# Patient Record
Sex: Female | Born: 1985
Health system: Southern US, Community
[De-identification: ages and names within clinical notes are randomized; demographics above are authoritative.]

## PROBLEM LIST (undated history)

## (undated) DIAGNOSIS — Z789 Other specified health status: Secondary | ICD-10-CM

## (undated) HISTORY — PX: NO PAST SURGERIES: SHX2092

## (undated) HISTORY — PX: OTHER SURGICAL HISTORY: SHX169

---

## 2012-08-21 ENCOUNTER — Other Ambulatory Visit (HOSPITAL_COMMUNITY)
Admission: RE | Admit: 2012-08-21 | Discharge: 2012-08-21 | Disposition: A | Discharge status: Home / Self Care | Payer: No Typology Code available for payment source | Source: Ambulatory Visit | Attending: Family Medicine | Admitting: Family Medicine

## 2012-08-21 DIAGNOSIS — Z1151 Encounter for screening for human papillomavirus (HPV): Secondary | ICD-10-CM | POA: Insufficient documentation

## 2012-08-21 DIAGNOSIS — Z124 Encounter for screening for malignant neoplasm of cervix: Secondary | ICD-10-CM | POA: Insufficient documentation

## 2014-02-01 NOTE — L&D Delivery Note (Signed)
Operative Delivery Note At 2:59 AM a viable female was delivered via Vaginal, Vacuum Investment banker, operational(Extractor).  Presentation: vertex; Position: Right,, Occiput,, Anterior; Station: +3 with pushing.  Patient was FD at 8:45 but not adequate until 11:45 when they started to increase pitocin after placing IUPC.  The patient then pushed for 3hr with exhaustion.  Options where discussed initially after 2hrs of pushing with adequate contractions and the patient wanted to push for another hour.  She ultimately opted for VAVD.  An interpreter was used the entire time (interpreter [phone]).  Verbal consent: obtained from patient.  Risks and benefits discussed in detail.  Risks include, but are not limited to the risks of anesthesia, bleeding, infection, damage to maternal tissues, fetal cephalhematoma.  There is also the risk of inability to effect vaginal delivery of the head, or shoulder dystocia that cannot be resolved by established maneuvers, leading to the need for emergency cesarean section.  APGAR: 8, 9; weight  .   Placenta status: Intact, Spontaneous.   Cord: 3 vessels with the following complications: None.  Cord pH: n/a  Anesthesia: Epidural  Instruments: Bell shaped vacuum Episiotomy: None Lacerations: 3rd degree Suture Repair: 2.0 3.0 vicryl and 0 vicryl Est. Blood Loss (mL):  576 cc  Mom to postpartum.  Baby to Couplet care / Skin to Skin.  Herschel Fleagle Y 12/01/2014, 4:04 AM

## 2014-05-20 LAB — OB RESULTS CONSOLE HIV ANTIBODY (ROUTINE TESTING): HIV: NONREACTIVE

## 2014-05-20 LAB — OB RESULTS CONSOLE ABO/RH: RH TYPE: POSITIVE

## 2014-05-20 LAB — OB RESULTS CONSOLE RPR: RPR: NONREACTIVE

## 2014-05-20 LAB — OB RESULTS CONSOLE HEPATITIS B SURFACE ANTIGEN: Hepatitis B Surface Ag: NEGATIVE

## 2014-05-20 LAB — OB RESULTS CONSOLE ANTIBODY SCREEN: Antibody Screen: NEGATIVE

## 2014-05-20 LAB — OB RESULTS CONSOLE RUBELLA ANTIBODY, IGM: RUBELLA: IMMUNE

## 2014-06-03 ENCOUNTER — Other Ambulatory Visit: Payer: Self-pay | Admitting: Obstetrics and Gynecology

## 2014-06-03 ENCOUNTER — Other Ambulatory Visit (HOSPITAL_COMMUNITY)
Admission: RE | Admit: 2014-06-03 | Discharge: 2014-06-03 | Disposition: A | Discharge status: Home / Self Care | Payer: 59 | Source: Ambulatory Visit | Attending: Obstetrics and Gynecology | Admitting: Obstetrics and Gynecology

## 2014-06-03 DIAGNOSIS — Z01419 Encounter for gynecological examination (general) (routine) without abnormal findings: Secondary | ICD-10-CM | POA: Diagnosis not present

## 2014-06-03 LAB — OB RESULTS CONSOLE GC/CHLAMYDIA
Chlamydia: NEGATIVE
Gonorrhea: NEGATIVE

## 2014-06-05 LAB — CYTOLOGY - PAP

## 2014-11-25 ENCOUNTER — Encounter (HOSPITAL_COMMUNITY): Payer: Self-pay | Admitting: *Deleted

## 2014-11-25 ENCOUNTER — Inpatient Hospital Stay (HOSPITAL_COMMUNITY)
Admission: AD | Admit: 2014-11-25 | Discharge: 2014-11-25 | Disposition: A | Discharge status: Home / Self Care | Payer: 59 | Source: Ambulatory Visit | Attending: Obstetrics and Gynecology | Admitting: Obstetrics and Gynecology

## 2014-11-25 DIAGNOSIS — Z3493 Encounter for supervision of normal pregnancy, unspecified, third trimester: Secondary | ICD-10-CM | POA: Diagnosis not present

## 2014-11-25 NOTE — MAU Note (Signed)
Pt presents to MAU with complaints of contractions since 3 this evening. Denies any vaginal bleeding or LOF

## 2014-11-25 NOTE — Discharge Instructions (Signed)

## 2014-11-25 NOTE — Progress Notes (Signed)
Dr Dion BodyVarnado notified of pt's concerns, VE, FHR pattern, and contraction pattern, orders received to discharge

## 2014-11-30 ENCOUNTER — Encounter (HOSPITAL_COMMUNITY): Payer: Self-pay

## 2014-11-30 ENCOUNTER — Inpatient Hospital Stay (HOSPITAL_COMMUNITY)
Admission: AD | Admit: 2014-11-30 | Discharge: 2014-12-03 | DRG: 775 | Disposition: A | Discharge status: Home / Self Care | Payer: 59 | Source: Ambulatory Visit | Attending: Obstetrics & Gynecology | Admitting: Obstetrics & Gynecology

## 2014-11-30 ENCOUNTER — Inpatient Hospital Stay (HOSPITAL_COMMUNITY): Payer: 59 | Admitting: Anesthesiology

## 2014-11-30 ENCOUNTER — Inpatient Hospital Stay (HOSPITAL_COMMUNITY): Payer: 59

## 2014-11-30 DIAGNOSIS — Z8619 Personal history of other infectious and parasitic diseases: Secondary | ICD-10-CM

## 2014-11-30 DIAGNOSIS — O4292 Full-term premature rupture of membranes, unspecified as to length of time between rupture and onset of labor: Secondary | ICD-10-CM | POA: Diagnosis present

## 2014-11-30 DIAGNOSIS — O480 Post-term pregnancy: Principal | ICD-10-CM | POA: Diagnosis present

## 2014-11-30 DIAGNOSIS — Z3A40 40 weeks gestation of pregnancy: Secondary | ICD-10-CM

## 2014-11-30 DIAGNOSIS — O4290 Premature rupture of membranes, unspecified as to length of time between rupture and onset of labor, unspecified weeks of gestation: Secondary | ICD-10-CM

## 2014-11-30 DIAGNOSIS — Z3689 Encounter for other specified antenatal screening: Secondary | ICD-10-CM

## 2014-11-30 HISTORY — DX: Other specified health status: Z78.9

## 2014-11-30 LAB — TYPE AND SCREEN
ABO/RH(D): A POS
Antibody Screen: NEGATIVE

## 2014-11-30 LAB — CBC
HCT: 34.2 % — ABNORMAL LOW (ref 36.0–46.0)
Hemoglobin: 12 g/dL (ref 12.0–15.0)
MCH: 33.4 pg (ref 26.0–34.0)
MCHC: 35.1 g/dL (ref 30.0–36.0)
MCV: 95.3 fL (ref 78.0–100.0)
PLATELETS: 119 10*3/uL — AB (ref 150–400)
RBC: 3.59 MIL/uL — AB (ref 3.87–5.11)
RDW: 12.6 % (ref 11.5–15.5)
WBC: 5.7 10*3/uL (ref 4.0–10.5)

## 2014-11-30 LAB — RAPID HIV SCREEN (HIV 1/2 AB+AG)
HIV 1/2 Antibodies: NONREACTIVE
HIV-1 P24 ANTIGEN - HIV24: NONREACTIVE

## 2014-11-30 LAB — OB RESULTS CONSOLE GBS: STREP GROUP B AG: NEGATIVE

## 2014-11-30 LAB — POCT FERN TEST: POCT Fern Test: POSITIVE

## 2014-11-30 LAB — ABO/RH: ABO/RH(D): A POS

## 2014-11-30 LAB — GROUP B STREP BY PCR: GROUP B STREP BY PCR: NEGATIVE

## 2014-11-30 MED ORDER — EPHEDRINE 5 MG/ML INJ
10.0000 mg | INTRAVENOUS | Status: DC | PRN
  Filled 2014-11-30: qty 2

## 2014-11-30 MED ORDER — BUPIVACAINE HCL (PF) 0.25 % IJ SOLN
INTRAMUSCULAR | Status: DC | PRN
  Administered 2014-11-30 (×2): 4 mL via EPIDURAL

## 2014-11-30 MED ORDER — OXYTOCIN 40 UNITS IN LACTATED RINGERS INFUSION - SIMPLE MED
62.5000 mL/h | INTRAVENOUS | Status: DC
  Filled 2014-11-30: qty 1000

## 2014-11-30 MED ORDER — DIPHENHYDRAMINE HCL 50 MG/ML IJ SOLN: 12.5000 mg | INTRAMUSCULAR | Status: DC | PRN

## 2014-11-30 MED ORDER — ACETAMINOPHEN 325 MG PO TABS: 650.0000 mg | ORAL_TABLET | ORAL | Status: DC | PRN

## 2014-11-30 MED ORDER — OXYTOCIN 40 UNITS IN LACTATED RINGERS INFUSION - SIMPLE MED
1.0000 m[IU]/min | INTRAVENOUS | Status: DC
  Administered 2014-11-30: 2 m[IU]/min via INTRAVENOUS

## 2014-11-30 MED ORDER — LACTATED RINGERS IV SOLN
INTRAVENOUS | Status: DC
  Administered 2014-11-30 – 2014-12-01 (×4): via INTRAVENOUS

## 2014-11-30 MED ORDER — ONDANSETRON HCL 4 MG/2ML IJ SOLN: 4.0000 mg | Freq: Four times a day (QID) | INTRAMUSCULAR | Status: DC | PRN

## 2014-11-30 MED ORDER — TERBUTALINE SULFATE 1 MG/ML IJ SOLN
0.2500 mg | Freq: Once | INTRAMUSCULAR | Status: DC | PRN
  Filled 2014-11-30: qty 1

## 2014-11-30 MED ORDER — OXYCODONE-ACETAMINOPHEN 5-325 MG PO TABS: 2.0000 | ORAL_TABLET | ORAL | Status: DC | PRN

## 2014-11-30 MED ORDER — FENTANYL CITRATE (PF) 100 MCG/2ML IJ SOLN: 50.0000 ug | INTRAMUSCULAR | Status: DC | PRN

## 2014-11-30 MED ORDER — LIDOCAINE HCL (PF) 1 % IJ SOLN
30.0000 mL | INTRAMUSCULAR | Status: DC | PRN
  Filled 2014-11-30: qty 30

## 2014-11-30 MED ORDER — FENTANYL 2.5 MCG/ML BUPIVACAINE 1/10 % EPIDURAL INFUSION (WH - ANES)
14.0000 mL/h | INTRAMUSCULAR | Status: DC | PRN
  Administered 2014-11-30: 12 mL/h via EPIDURAL
  Administered 2014-11-30: 14 mL/h via EPIDURAL
  Filled 2014-11-30 (×2): qty 125

## 2014-11-30 MED ORDER — LIDOCAINE-EPINEPHRINE (PF) 2 %-1:200000 IJ SOLN
INTRAMUSCULAR | Status: DC | PRN
  Administered 2014-11-30: 3 mL

## 2014-11-30 MED ORDER — CITRIC ACID-SODIUM CITRATE 334-500 MG/5ML PO SOLN
30.0000 mL | ORAL | Status: DC | PRN
Start: 2014-11-30 — End: 2014-12-01

## 2014-11-30 MED ORDER — PHENYLEPHRINE 40 MCG/ML (10ML) SYRINGE FOR IV PUSH (FOR BLOOD PRESSURE SUPPORT)
80.0000 ug | PREFILLED_SYRINGE | INTRAVENOUS | Status: DC | PRN
  Filled 2014-11-30: qty 2
  Filled 2014-11-30: qty 20

## 2014-11-30 MED ORDER — OXYCODONE-ACETAMINOPHEN 5-325 MG PO TABS
1.0000 | ORAL_TABLET | ORAL | Status: DC | PRN
Start: 2014-11-30 — End: 2014-12-01

## 2014-11-30 MED ORDER — LACTATED RINGERS IV SOLN
500.0000 mL | INTRAVENOUS | Status: DC | PRN
  Administered 2014-11-30: 500 mL via INTRAVENOUS

## 2014-11-30 MED ORDER — OXYTOCIN BOLUS FROM INFUSION: 500.0000 mL | INTRAVENOUS | Status: DC

## 2014-11-30 NOTE — Progress Notes (Signed)
[employer] Interpreter Service used Via phone, Geneticist, molecularTranslator Becky. RN present at bedside.

## 2014-11-30 NOTE — Progress Notes (Signed)
Warden/rangeracific Interpreter Service used via phone for translation before and during epidural

## 2014-11-30 NOTE — Progress Notes (Addendum)
  Subjective: Still no urge to push s/p passive descent x 1 hr.   Objective: BP 127/77 mmHg  Pulse 77  Temp(Src) 99 F (37.2 C) (Oral)  Resp 16  Ht 5' 6.14" (1.68 m)  Wt 71.668 kg (158 lb)  BMI 25.39 kg/m2  SpO2 98%  LMP 02/22/2014 I/O last 3 completed shifts: In: 360 [P.O.:360] Out: 400 [Urine:400]    FHT: Category 1 UC:   irregular, every 3-4 minutes SVE:   Dilation: 10 Effacement (%): 100 Station: 0 Exam by:: Thornell MuleK. Lain Tetterton, CNM Pitocin at 20 mU/min  Assessment:  2nd stage labor GBS neg  Plan: Epidural reduced in 1/2 by Anesthesia. After speaking w/ Dr. Su Hiltoberts, will proceed w/ pushing anyway with interpreter on phone during process and place IUPC. Expect progress and SVD.   Sherre ScarletWILLIAMS, Chesley Veasey CNM 11/30/2014, 11:19 PM

## 2014-11-30 NOTE — Progress Notes (Signed)
Labor Progress  Subjective: Pt report increase pain and is requesting an epidural  Objective: BP 119/70 mmHg  Pulse 76  Temp(Src) 97.7 F (36.5 C) (Oral)  Resp 18  Ht 5' 6.14" (1.68 m)  Wt 158 lb (71.668 kg)  BMI 25.39 kg/m2  LMP 02/22/2014     FHT: 135 CTX:  regular, every 3-4 minutes Uterus gravid, soft non tender SVE:  Dilation: 3.5 Effacement (%): 100 Station: -1 Exam by:: Marcelino DusterMichelle, RN  Pitocin at 10212mUn/min  Assessment:  IUP at 40.1 weeks NICHD: Category 1 Membranes:  SROM x 10hrs, no s/s of infection Labor progress augmentation SP SROM Pitocin Augmentation GBS: negative  Plan: Continue labor plan Continuous monitoring Rest Ambulate Frequent position changes to facilitate fetal rotation and descent. Will reassess with cervical exam at 1600or earlier if necessary Continue pitocin per protocol Epidural per pt request     Abbi Mancini, CNM, MSN 11/30/2014. 1:18 PM

## 2014-11-30 NOTE — Anesthesia Procedure Notes (Signed)
Epidural Patient location during procedure: OB  Staffing Anesthesiologist: Mykai Wendorf Performed by: anesthesiologist   Preanesthetic Checklist Completed: patient identified, surgical consent, pre-op evaluation, timeout performed, IV checked, risks and benefits discussed and monitors and equipment checked  Epidural Patient position: sitting Prep: DuraPrep Patient monitoring: heart rate, cardiac monitor, continuous pulse ox and blood pressure Approach: midline Location: L3-L4 Injection technique: LOR saline  Needle:  Needle type: Tuohy  Needle gauge: 17 G Needle length: 9 cm Needle insertion depth: 6 cm Catheter type: closed end flexible Catheter size: 19 Gauge Catheter at skin depth: 12 cm Test dose: negative and 2% lidocaine with Epi 1:200 K  Assessment Events: blood not aspirated, injection not painful, no injection resistance, negative IV test and no paresthesia  Additional Notes Reason for block:procedure for pain   

## 2014-11-30 NOTE — Progress Notes (Signed)
Labor Progress  Subjective: Pt anbulating in the room FOB at the bedside for support.  Pt speaks limited English with Mandrin as the first language.  Reviewed concerns for prolong ROM with no progress.  Augmentation reviewed via an interpretor [phone]#[id number].  Pain management reviewed.  Pt very concern that any  Or all medication will have a bad side effect on the baby.  R&B explained via interpretor.  Pt agreed to pitocin and will discuss pain medicine with her husband.    Objective: BP 125/68 mmHg  Pulse 71  Temp(Src) 97.6 F (36.4 C) (Oral)  Resp 18  Ht 5' 6.14" (1.68 m)  Wt 158 lb (71.668 kg)  BMI 25.39 kg/m2  LMP 02/22/2014     FHT: 130, moderate variability, + accel, no decel CTX:  regular, every 6-8 minutes Uterus gravid, soft non tender SVE:  Dilation: 3 Effacement (%): 80 Station: -1 Exam by:: Marcelino DusterMichelle, RN    Assessment:  IUP at 40.1 weeks NICHD: Category 1 Membranes:  SROM x 6.5hrs, no s/s of infection Labor progress: Inadquate labor progress GBS: negative  Plan: Continue labor plan Continuous monitoring Ambulate Frequent position changes to facilitate fetal rotation and descent. Will reassess with cervical exam at 1200 or earlier if necessary Start pitocin per protocol 2x2      Lorilynn Lehr, CNM, MSN 11/30/2014. 10:17 AM

## 2014-11-30 NOTE — Progress Notes (Signed)
[employer] Interpreter service used to inform pt of starting IV pitocin. All questions answered.

## 2014-11-30 NOTE — Progress Notes (Signed)
Labor Progress  Subjective: Comfortable with epidural.  C/O slight HA but is declining tylenol.  The FOB expressed worry that it may not be safe for the baby.    Objective: BP 110/63 mmHg  Pulse 76  Temp(Src) 97.8 F (36.6 C) (Oral)  Resp 18  Ht 5' 6.14" (1.68 m)  Wt 158 lb (71.668 kg)  BMI 25.39 kg/m2  SpO2 98%  LMP 02/22/2014   Total I/O In: 360 [P.O.:360] Out: -  FHT: 135, moderate variability, + accel, no decel CTX:  regular, every 3-5 minutes Uterus gravid, soft non tender SVE:  Dilation: 5.5 Effacement (%): 90 Station: 0 Exam by:: V Jemima Petko CNM Pitocin at 4018mUn/min  Assessment:  IUP at 40.1 weeks NICHD: Category 1 Membranes:  SROM x 15hrs, no s/s of infection Labor progress:augmentation Pitocin Augmentation GBS: negative   Plan: Continue labor plan Continuous monitoring Rest Frequent position changes to facilitate fetal rotation and descent. Continue pitocin per protocol      Ezekial Arns, CNM, MSN 11/30/2014. 4:53 PM

## 2014-11-30 NOTE — Anesthesia Preprocedure Evaluation (Signed)
Anesthesia Evaluation  Patient identified by MRN, date of birth, ID band Patient awake    Reviewed: Allergy & Precautions, Patient's Chart, lab work & pertinent test results  History of Anesthesia Complications Negative for: history of anesthetic complications  Airway Mallampati: I  TM Distance: >3 FB Neck ROM: Full    Dental  (+) Teeth Intact   Pulmonary neg pulmonary ROS,  breath sounds clear to auscultation        Cardiovascular negative cardio ROS  Rhythm:Regular     Neuro/Psych negative neurological ROS  negative psych ROS   GI/Hepatic negative GI ROS, Neg liver ROS,   Endo/Other  negative endocrine ROS  Renal/GU negative Renal ROS     Musculoskeletal   Abdominal   Peds  Hematology negative hematology ROS (+)   Anesthesia Other Findings   Reproductive/Obstetrics (+) Pregnancy                             Anesthesia Physical Anesthesia Plan  ASA: II  Anesthesia Plan: Epidural   Post-op Pain Management:    Induction:   Airway Management Planned:   Additional Equipment:   Intra-op Plan:   Post-operative Plan:   Informed Consent: I have reviewed the patients History and Physical, chart, labs and discussed the procedure including the risks, benefits and alternatives for the proposed anesthesia with the patient or authorized representative who has indicated his/her understanding and acceptance.   Dental advisory given  Plan Discussed with: Anesthesiologist  Anesthesia Plan Comments:         Anesthesia Quick Evaluation  

## 2014-11-30 NOTE — H&P (Signed)
Morgan Ray is a 29 y.o. female presenting for SROM.  Patient reports gush of fluid at 0300 that was clear.  Good fetal movement and irregular contractions.  Patient denies VB.  Patient reports negative GBS and no issues during the pregnancy.  Patient speaks Mandarin, but does speak and understand limited [place].   Maternal Medical History:  Reason for admission: Rupture of membranes.   Contractions: Frequency: irregular.   Perceived severity is mild.    Fetal activity: Perceived fetal activity is normal.   Last perceived fetal movement was within the past hour.      OB History    Gravida Para Term Preterm AB TAB SAB Ectopic Multiple Living   1              Past Medical History  Diagnosis Date  . Medical history non-contributory    Past Surgical History  Procedure Laterality Date  . No past surgeries     Family History: family history includes Hepatitis B in her father. Social History:  reports that she has never smoked. She has never used smokeless tobacco. She reports that she does not drink alcohol or use illicit drugs.   Prenatal Transfer Tool  Maternal Diabetes: No Genetic Screening: Normal Maternal Ultrasounds/Referrals: Normal Fetal Ultrasounds or other Referrals:  None Maternal Substance Abuse:  No Significant Maternal Medications:  Meds include: Other: Prilosec Significant Maternal Lab Results:  None Other Comments:  None  Review of Systems  All other systems reviewed and are negative.     Last menstrual period 02/22/2014. Maternal Exam:  Uterine Assessment: Contraction strength is mild.  Contraction frequency is regular.   Abdomen: Patient reports no abdominal tenderness. Fundal height is AGA.   Fetal presentation: vertex  Introitus: Ferning test: positive.  Amniotic fluid character: clear.     Physical Exam  Vitals reviewed. Constitutional: She is oriented to person, place, and time. She appears well-developed and well-nourished.  HENT:  Head:  Normocephalic and atraumatic.  Eyes: Pupils are equal, round, and reactive to light.  Neck: Normal range of motion.  Cardiovascular: Normal rate.   Respiratory: Effort normal.  GI: Soft.  Musculoskeletal: Normal range of motion.  Neurological: She is alert and oriented to person, place, and time.  Skin: Skin is warm and dry.    Prenatal labs: ABO, Rh:  A Positive Antibody:  Negative Rubella:  Immune RPR:   NR HBsAg:   Negative HIV:   NR GBS:   Negative per Patient  Assessment/Plan: IUP at 40.1wks Cat I FT PROM Early Labor GBS Negative per Patient Language Barrier  [facility]  Routine Labor and Delivery Orders  -Okay for epidural when desired -Perform rapid GBS to confirm negative -Utilize interpreter services as needed -Expectant Mgmt Dr.E. Sallye OberKulwa to be updated as appropriate  Morgan Jobst LYNN MSN, CNM 11/30/2014, 4:10 AM

## 2014-11-30 NOTE — MAU Note (Signed)
At 0310, had leaking fluid, watery with pinkish.  Baby moving well today.  Contractions but not painful.

## 2014-11-30 NOTE — Progress Notes (Signed)
In to make the acquaintance of Morgan Ray, 29 yo G1P0 admitted early this morning w/ ROM. Spouse at bedside. Couple speaks Mandarin and comprehends my introduction and brief questions.   Subjective: Reports h/a and need to vomit but declines meds for either complaint. No urge to push expressed. Spouse requests Mandarin interpreter when time to push.  Objective: BP 102/49 mmHg  Pulse 77  Temp(Src) 98.5 F (36.9 C) (Oral)  Resp 16  Ht 5' 6.14" (1.68 m)  Wt 71.668 kg (158 lb)  BMI 25.39 kg/m2  SpO2 98%  LMP 02/22/2014 I/O last 3 completed shifts: In: 360 [P.O.:360] Out: 400 [Urine:400]   Today's Vitals   11/30/14 1931 11/30/14 2001 11/30/14 2031 11/30/14 2101  BP: 105/49 129/79 125/73 110/67  Pulse: 75 106 79 82  Temp:   99 F (37.2 C)   TempSrc:   Oral   Resp: 16 16 16 16   Height:      Weight:      SpO2:      PainSc:       FHT: BL 125 w/ moderate variability, +accels, occ mild variables, no lates UC:   irregular, every 1-4 minutes SVE: C/C/0    Pitocin at 20 mU/min  Assessment:  IUP at 40.1 wks SROM x 17.5 hrs Low grade temp Overall Cat 1 2nd stage labor GBS neg  Plan: Trial push attempted w/ poor effort.  Will allow passive descent x 1 hr; couple in agreement. Dr. Su Hiltoberts updated.  Sherre ScarletWILLIAMS, Trigger Frasier CNM 11/30/2014, 8:55 PM

## 2014-11-30 NOTE — Progress Notes (Addendum)
S: Now able to feel ctxs. Pushing since 22:36 PM.  O:  Today's Vitals   11/30/14 2131 11/30/14 2201 11/30/14 2230 11/30/14 2301  BP: 123/77 131/78 127/77 127/69  Pulse: 85 84 77 75  Temp:      TempSrc:      Resp: 16 16 16 16   Height:      Weight:      SpO2:      PainSc:      BL FHR 145 w/ moderate variability, +accels, no decels. Ctxs q 2-3 min, MVUs < 180. Education officer, museumMandarin Interpreter on phone. Current temp 98.1. IUPC placed as per Dr. Su Hiltoberts. Pitocin increased to 22 mU/min.  A: 29 yo G1P0 @ term. SROM since 03:00 AM today. Afebrile. 2nd stage labor; pushing well. Inadequate ctxs.  P: Continue to increase Pitocin prn. Continue w/ Mandarin interpreter during pushing phase and likely after delivery and during repair if needed. Expect progress and SVD. Continue to update Dr. Su Hiltoberts.  Sherre ScarletKimberly Oni Dietzman, CNM 11/30/14 @ 11:41 PM

## 2014-12-01 ENCOUNTER — Encounter (HOSPITAL_COMMUNITY): Payer: Self-pay | Admitting: *Deleted

## 2014-12-01 LAB — RPR: RPR: NONREACTIVE

## 2014-12-01 MED ORDER — OXYCODONE-ACETAMINOPHEN 5-325 MG PO TABS
2.0000 | ORAL_TABLET | ORAL | Status: DC | PRN
  Administered 2014-12-02: 2 via ORAL
  Filled 2014-12-01: qty 2

## 2014-12-01 MED ORDER — WITCH HAZEL-GLYCERIN EX PADS
1.0000 "application " | MEDICATED_PAD | CUTANEOUS | Status: DC | PRN
  Administered 2014-12-01: 1 via TOPICAL

## 2014-12-01 MED ORDER — BENZOCAINE-MENTHOL 20-0.5 % EX AERO
1.0000 "application " | INHALATION_SPRAY | CUTANEOUS | Status: DC | PRN
  Administered 2014-12-01 – 2014-12-03 (×2): 1 via TOPICAL
  Filled 2014-12-01 (×3): qty 56

## 2014-12-01 MED ORDER — LANOLIN HYDROUS EX OINT: TOPICAL_OINTMENT | CUTANEOUS | Status: DC | PRN

## 2014-12-01 MED ORDER — IBUPROFEN 600 MG PO TABS
600.0000 mg | ORAL_TABLET | Freq: Four times a day (QID) | ORAL | Status: DC
  Administered 2014-12-01 – 2014-12-03 (×9): 600 mg via ORAL
  Filled 2014-12-01 (×9): qty 1

## 2014-12-01 MED ORDER — OXYCODONE-ACETAMINOPHEN 5-325 MG PO TABS
1.0000 | ORAL_TABLET | ORAL | Status: DC | PRN
  Administered 2014-12-01 – 2014-12-02 (×2): 1 via ORAL
  Filled 2014-12-01 (×2): qty 1

## 2014-12-01 MED ORDER — ZOLPIDEM TARTRATE 5 MG PO TABS: 5.0000 mg | ORAL_TABLET | Freq: Every evening | ORAL | Status: DC | PRN

## 2014-12-01 MED ORDER — DIPHENHYDRAMINE HCL 25 MG PO CAPS: 25.0000 mg | ORAL_CAPSULE | Freq: Four times a day (QID) | ORAL | Status: DC | PRN

## 2014-12-01 MED ORDER — ONDANSETRON HCL 4 MG/2ML IJ SOLN: 4.0000 mg | INTRAMUSCULAR | Status: DC | PRN

## 2014-12-01 MED ORDER — SENNOSIDES-DOCUSATE SODIUM 8.6-50 MG PO TABS
2.0000 | ORAL_TABLET | ORAL | Status: DC
  Administered 2014-12-02 (×2): 2 via ORAL
  Filled 2014-12-01 (×2): qty 2

## 2014-12-01 MED ORDER — PRENATAL MULTIVITAMIN CH
1.0000 | ORAL_TABLET | Freq: Every day | ORAL | Status: DC
  Administered 2014-12-01 – 2014-12-03 (×3): 1 via ORAL
  Filled 2014-12-01 (×3): qty 1

## 2014-12-01 MED ORDER — ACETAMINOPHEN 325 MG PO TABS
650.0000 mg | ORAL_TABLET | ORAL | Status: DC | PRN
  Administered 2014-12-02 (×2): 650 mg via ORAL
  Filled 2014-12-01 (×2): qty 2

## 2014-12-01 MED ORDER — TETANUS-DIPHTH-ACELL PERTUSSIS 5-2.5-18.5 LF-MCG/0.5 IM SUSP: 0.5000 mL | Freq: Once | INTRAMUSCULAR | Status: DC

## 2014-12-01 MED ORDER — ONDANSETRON HCL 4 MG PO TABS: 4.0000 mg | ORAL_TABLET | ORAL | Status: DC | PRN

## 2014-12-01 MED ORDER — SIMETHICONE 80 MG PO CHEW: 80.0000 mg | CHEWABLE_TABLET | ORAL | Status: DC | PRN

## 2014-12-01 MED ORDER — DIBUCAINE 1 % RE OINT: 1.0000 "application " | TOPICAL_OINTMENT | RECTAL | Status: DC | PRN

## 2014-12-01 NOTE — Progress Notes (Signed)
Morgan Ray   Subjective: Post Partum Day 0 VAVD 3rd degree laceration Patient up ad lib, denies syncope or dizziness. Reports consuming regular diet without issues and denies N/V No issues with urination and reports bleeding is appropriate  Feeding:  breast Contraceptive plan:   unsure  Objective: Temp:  [97.8 F (36.6 C)-99 F (37.2 C)] 98.3 F (36.8 C) (10/30 1107) Pulse Rate:  [70-106] 73 (10/30 1107) Resp:  [16-20] 16 (10/30 1107) BP: (101-131)/(49-88) 102/65 mmHg (10/30 1107) SpO2:  [91 %-100 %] 98 % (10/29 1416)  Physical Exam:  General: alert and cooperative Ext: WNL, no significant  edema. No evidence of DVT seen on physical exam. Breast: Soft filling Lungs: CTAB Heart RRR without murmur  Abdomen:  Soft, fundus firm, lochia scant, + bowel sounds, non distended, non tender Lochia: appropriate Uterine Fundus: firm Laceration: healing well    Recent Labs  11/30/14 0507  HGB 12.0  HCT 34.2*    Assessment S/P Vaginal Delivery-Day 0 Stable  Normal [employer] Breastfeeding  Plan: Continue current care Plan for discharge tomorrow, Breastfeeding and Lactation consult Lactation support   Virgilene Stryker, CNM, MSN 12/01/2014, 12:11 PM

## 2014-12-01 NOTE — Lactation Note (Signed)
This note was copied from the chart of Morgan Darline Mance. Lactation Consultation Note; RN had assisted mom with latch with NS. Baby nursing well when I went into room. Encouraged mom to keep baby close to the breast- she is letting her slide down. Baby came off the breast. Attempted to latch without NS, baby took a few sucks then off to sleep. Colostrum noted in NS when baby came off the breast. Reviewed feeding cues and encouraged to feed whenever she sees them. Reviewed placement of NS and mom able to correctly demonstrate placement of NS onto nipple. Dad asking about giving formula- reviewed supply and demand and encouraged frequent breast feeding to promote a good milk supply. No questions at present. To call for assist prn  Patient Name: Morgan Ray Today's Date: 12/01/2014 Reason for consult: Initial assessment   Maternal Data Formula Feeding for Exclusion: No Does the patient have breastfeeding experience prior to this delivery?: No  Feeding Feeding Type: Breast Fed Length of feed: 25 min  LATCH Score/Interventions Latch: Grasps breast easily, tongue down, lips flanged, rhythmical sucking.  Audible Swallowing: A few with stimulation  Type of Nipple: Flat  Comfort (Breast/Nipple): Soft / non-tender     Hold (Positioning): Assistance needed to correctly position infant at breast and maintain latch. Intervention(s): Breastfeeding basics reviewed  LATCH Score: 7  Lactation Tools Discussed/Used Tools: Nipple Shields Nipple shield size: 20   Consult Status Consult Status: Follow-up Date: 12/02/14 Follow-up type: In-patient    Pamelia HoitWeeks, Caitrin Pendergraph D 12/01/2014, 12:12 PM

## 2014-12-02 LAB — CBC
HEMATOCRIT: 29.4 % — AB (ref 36.0–46.0)
HEMOGLOBIN: 10.2 g/dL — AB (ref 12.0–15.0)
MCH: 33.2 pg (ref 26.0–34.0)
MCHC: 34.7 g/dL (ref 30.0–36.0)
MCV: 95.8 fL (ref 78.0–100.0)
Platelets: 101 10*3/uL — ABNORMAL LOW (ref 150–400)
RBC: 3.07 MIL/uL — AB (ref 3.87–5.11)
RDW: 13.2 % (ref 11.5–15.5)
WBC: 9.4 10*3/uL (ref 4.0–10.5)

## 2014-12-02 NOTE — Progress Notes (Signed)
Postpartum day #1, VAVD, 3rd degree lacertation  Subjective Pt without complaints.  Lochia normal.  Pain is 7/10 prior to one Percocet, now down to 6/10.  Pt reluctant to take 2 tablets of Percocet. Pt has not done Sitz bath yet as recommended by RN previously.  Breast feeding yes.  Baby with tight frenulum.  Having some difficulty breast feeding, currently bottle feeding.  Working with lactation.  Temp:  [97.6 F (36.4 C)-98.4 F (36.9 C)] 98.2 F (36.8 C) (10/31 1253) Pulse Rate:  [69-119] 91 (10/31 1253) Resp:  [15-18] 16 (10/31 1253) BP: (104-123)/(54-74) 109/70 mmHg (10/31 1253) SpO2:  [99 %] 99 % (10/31 1253)  Gen:  NAD, A&O x 3 Perineum:  No swelling, signs of hematoma.  Repair is intact, no signs of infection. Lochia normal   CBC    Component Value Date/Time   WBC 9.4 12/02/2014 0535   RBC 3.07* 12/02/2014 0535   HGB 10.2* 12/02/2014 0535   HCT 29.4* 12/02/2014 0535   PLT 101* 12/02/2014 0535   MCV 95.8 12/02/2014 0535   MCH 33.2 12/02/2014 0535   MCHC 34.7 12/02/2014 0535   RDW 13.2 12/02/2014 0535     A/P: S/p VAVD doing well. 3rd degree laceration-repair intact.  Recommend Sitz bath and 2 tabs of Percocet. Routine postpartum care. Lactation support. Discharge in am. Dr. Richardson Doppole covering until 7 pm.  Morgan Ray, Morgan Ray 12/02/2014, 3:10 PM

## 2014-12-02 NOTE — Lactation Note (Signed)
This note was copied from the chart of Morgan Ray. Lactation Consultation Note New mom having painful latches. RN gave NS. Mom BF w/o NS. Has short shaft small nipples. Baby BF to Rt. Breast. Lt. Nipple has small crack, very tender. Breast heavy, hand expression attempted, mom to tender. Encouraged to hand express and let colostrum dry to nipples. Comfort gels given. Have hand pump, couldn't tolerate d/t soreness. Gave shells to wear in bra to assist nipple to evert for a deeper latch. Mom stated she didn't want to wear NS if she could latch w/o it. Stated I understood, monitor for pain w/latches. Encouraged to flange mouth open more. Mom BF in football position w/good latch, adjusted lips. Patient Name: Morgan Ray AVWUJ'WToday's Date: 12/02/2014 Reason for consult: Follow-up assessment;Breast/nipple pain   Maternal Data    Feeding Feeding Type: Breast Fed Length of feed: 10 min  LATCH Score/Interventions Latch: Grasps breast easily, tongue down, lips flanged, rhythmical sucking. Intervention(s): Adjust position;Assist with latch;Breast massage;Breast compression  Audible Swallowing: A few with stimulation Intervention(s): Skin to skin;Hand expression Intervention(s): Hand expression  Type of Nipple: Everted at rest and after stimulation Intervention(s): Shells  Comfort (Breast/Nipple): Filling, red/small blisters or bruises, mild/mod discomfort  Problem noted: Cracked, bleeding, blisters, bruises Interventions  (Cracked/bleeding/bruising/blister): Hand pump;Expressed breast milk to nipple Interventions (Mild/moderate discomfort): Comfort gels;Breast shields;Hand massage;Hand expression  Hold (Positioning): Assistance needed to correctly position infant at breast and maintain latch. Intervention(s): Support Pillows;Position options;Breastfeeding basics reviewed;Skin to skin  LATCH Score: 7  Lactation Tools Discussed/Used Tools: Shells;Nipple Shields;Pump;Comfort  gels Nipple shield size: 20 Shell Type: Inverted Breast pump type: Manual Pump Review: Setup, frequency, and cleaning;Milk Storage Initiated by:: Peri JeffersonL. Kenslee Achorn RN Date initiated:: 12/02/14   Consult Status Consult Status: Follow-up Date: 12/02/14 Follow-up type: In-patient    Blessing Zaucha, Diamond NickelLAURA G 12/02/2014, 8:25 AM

## 2014-12-02 NOTE — Lactation Note (Signed)
This note was copied from the chart of Girl Sheelah Rauber. Lactation Consultation Note  Patient Name: Girl Lucendia HerrlichBingqing Fredin WGNFA'OToday's Date: 12/02/2014 Reason for consult: Follow-up assessment   With this mom and term baby, now 2631 hours old. Mom has been having trouble latching the baby. On exam, mom has small, firm , round breast, WNL, and small, evert nipples. Mom has the start of cracking in the middle of her right nipple. I applied nipple shield with mom's permission, and the baby latched well, deeply with strong , rhythmic suckles, but mom asked that we stop and unlatch the baby due eo severe pain.  On exam of baby's mouth, she has an upper lip frenulum that extends to the gum line, a high palate, and a tongue frenulum that is posterior, close to the front of the tongue, but not on the tip, short, blanched with elevation. With tongue sucking, she has a strong, rhythmic suck, and can pull my finger into her mouth, and her tongue is slightly under my finger.  I spoke to Dr. Ezequiel EssexGable , and told her my findings. She said that since the parents were asking for formula, to start 20 ml's of sim 19, and that she would examine the baby later today. The baby had a difficult time making a seal on the nipple, but did toake 20 ml's and tolerated well. Mom needs lots of help with handing the baby.  I set up a DEP for mom, and began her pumping in premie setting, every 3 hours. With hand expression, mom very tender, and I was not able to express any colostrum at all.  Mom knows to call for questions/concerns,    Maternal Data    Feeding Feeding Type: Formula Nipple Type: Slow - flow Length of feed: 15 min (3 minutes at breast - too painful for mom, stopped)  LATCH Score/Interventions Latch: Grasps breast easily, tongue down, lips flanged, rhythmical sucking. Intervention(s): Adjust position;Assist with latch;Breast compression  Audible Swallowing: None Intervention(s): Skin to skin;Hand expression  Type of  Nipple: Everted at rest and after stimulation  Comfort (Breast/Nipple): Engorged, cracked, bleeding, large blisters, severe discomfort Problem noted: Cracked, bleeding, blisters, bruises  Problem noted: Severe discomfort Interventions  (Cracked/bleeding/bruising/blister): Expressed breast milk to nipple;Double electric pump Interventions (Mild/moderate discomfort): Comfort gels  Hold (Positioning): Assistance needed to correctly position infant at breast and maintain latch. Intervention(s): Breastfeeding basics reviewed;Support Pillows;Position options;Skin to skin  LATCH Score: 5  Lactation Tools Discussed/Used Tools: Flanges Nipple shield size: Other (comment) (21) Breast pump type: Double-Electric Breast Pump   Consult Status Consult Status: Follow-up Date: 12/03/14 Follow-up type: In-patient    Alfred LevinsLee, Zaylia Riolo Anne 12/02/2014, 10:44 AM

## 2014-12-02 NOTE — Discharge Instructions (Signed)

## 2014-12-03 MED ORDER — IBUPROFEN 600 MG PO TABS: 600.0000 mg | ORAL_TABLET | Freq: Four times a day (QID) | ORAL | Status: DC

## 2014-12-03 MED ORDER — SENNOSIDES-DOCUSATE SODIUM 8.6-50 MG PO TABS: 2.0000 | ORAL_TABLET | ORAL | Status: DC

## 2014-12-03 MED ORDER — OXYCODONE-ACETAMINOPHEN 5-325 MG PO TABS: 2.0000 | ORAL_TABLET | ORAL | Status: DC | PRN

## 2014-12-03 NOTE — Progress Notes (Signed)
CSW notified by RN that MOB had reported difficulties obtaining formula.  CSW briefly met with MOB and did not complete full psychosocial assessment since there were no social work needs identified.   MOB reported that she was working during the pregnancy, and her mother will continue to support her as she transitions postpartum.  MOB stated that she has access to transportation, and has money to buy formula.  MOB denied barriers to accessing formula prior to her [facility] appointment.   Contact CSW if additional needs arise. No barriers to discharge.

## 2014-12-03 NOTE — Discharge Summary (Signed)
OB Discharge Summary     Patient Name: Morgan Ray DOB: 1985-09-25 MRN: [id number]  Date of admission: 11/30/2014 Delivering MD: Osborn CohoOBERTS, ANGELA   Date of discharge: 12/03/2014  Admitting diagnosis: 40wks, [place], CTX Intrauterine pregnancy: 6712w2d     Secondary diagnosis:  Principal Problem:   PROM (premature rupture of membranes)  Additional problems: None     Discharge diagnosis: Term Pregnancy Delivered , VAVD                                                                                             Post partum procedures:None  Augmentation: Pitocin  Complications: None  Hospital course:  Onset of Labor With Vaginal Delivery     29 y.o. yo G1P1001 at 1812w2d was admitted in Latent Laboron 11/30/2014. Patient had an uncomplicated labor course as follows:  Membrane Rupture Time/Date: 3:10 AM ,11/30/2014   Intrapartum Procedures: Episiotomy: None [1]                                         Lacerations:  3rd degree [4]  Patient had a delivery of a Viable infant. 12/01/2014  Information for the patient's newborn:  Reynolds BowlLu, Girl Eunie [[id number]][[id number]]  Delivery Method: Vaginal, Vacuum (Extractor) (Filed from Delivery Summary)    Pateint had an uncomplicated postpartum course.  She is ambulating, tolerating a regular diet, passing flatus, and urinating well. Patient is discharged home in stable condition on No discharge date for patient encounter.Marland Kitchen.    Physical exam  Filed Vitals:   12/02/14 0930 12/02/14 1253 12/02/14 1811 12/03/14 0630  BP: 104/74 109/70 105/64 127/75  Pulse: 69 91 88 71  Temp: 97.6 F (36.4 C) 98.2 F (36.8 C) 98.3 F (36.8 C) 98 F (36.7 C)  TempSrc: Oral Oral Oral   Resp: 15 16 18 16   Height:      Weight:      SpO2: 99% 99%     General: alert Lochia: appropriate Uterine Fundus: firm Incision: N/A DVT Evaluation: No evidence of DVT seen on physical exam. Calf/Ankle edema is present Labs: Lab Results  Component Value Date   WBC 9.4  12/02/2014   HGB 10.2* 12/02/2014   HCT 29.4* 12/02/2014   MCV 95.8 12/02/2014   PLT 101* 12/02/2014   No flowsheet data found.  Discharge instruction: per After Visit Summary and "Baby and Me Booklet".  Medications:  Current facility-administered medications:  .  acetaminophen (TYLENOL) tablet 650 mg, 650 mg, Oral, Q4H PRN, Osborn CohoAngela Roberts, MD, 650 mg at 12/02/14 0551 .  benzocaine-Menthol (DERMOPLAST) 20-0.5 % topical spray 1 application, 1 application, Topical, PRN, Osborn CohoAngela Roberts, MD, 1 application at 12/01/14 [phone] .  witch hazel-glycerin (TUCKS) pad 1 application, 1 application, Topical, PRN, 1 application at 12/01/14 0624 **AND** dibucaine (NUPERCAINAL) 1 % rectal ointment 1 application, 1 application, Rectal, PRN, Osborn CohoAngela Roberts, MD .  diphenhydrAMINE (BENADRYL) capsule 25 mg, 25 mg, Oral, Q6H PRN, Osborn CohoAngela Roberts, MD .  diphenhydrAMINE (BENADRYL) injection 12.5 mg, 12.5 mg, Intravenous, Q15 min PRN, Phillips GroutPeter Carignan, MD .  ePHEDrine injection 10 mg, 10 mg, Intravenous, PRN, Phillips Grout, MD .  fentaNYL 2.5 mcg/ml w/bupivacaine 0.1% in NS epidural infusion [facility]), 14 mL/hr, Epidural, Continuous PRN, Phillips Grout, MD, Last Rate: 14 mL/hr at 11/30/14 2022, 14 mL/hr at 11/30/14 2022 .  ibuprofen (ADVIL,MOTRIN) tablet 600 mg, 600 mg, Oral, 4 times per day, Osborn Coho, MD, 600 mg at 12/03/14 0514 .  lanolin ointment, , Topical, PRN, Osborn Coho, MD .  ondansetron [facility]) tablet 4 mg, 4 mg, Oral, Q4H PRN **OR** ondansetron (ZOFRAN) injection 4 mg, 4 mg, Intravenous, Q4H PRN, Osborn Coho, MD .  oxyCODONE-acetaminophen (PERCOCET/ROXICET) 5-325 MG per tablet 1 tablet, 1 tablet, Oral, Q4H PRN, Osborn Coho, MD, 1 tablet at 12/02/14 1331 .  oxyCODONE-acetaminophen (PERCOCET/ROXICET) 5-325 MG per tablet 2 tablet, 2 tablet, Oral, Q4H PRN, Osborn Coho, MD, 2 tablet at 12/02/14 2113 .  PHENYLephrine 40 mcg/ml in normal saline Adult IV Push Syringe, 80 mcg, Intravenous, PRN,  Phillips Grout, MD .  prenatal multivitamin tablet 1 tablet, 1 tablet, Oral, Q1200, Osborn Coho, MD, 1 tablet at 12/02/14 1204 .  senna-docusate (Senokot-S) tablet 2 tablet, 2 tablet, Oral, Q24H, Osborn Coho, MD, 2 tablet at 12/02/14 2333 .  simethicone (MYLICON) chewable tablet 80 mg, 80 mg, Oral, PRN, Osborn Coho, MD .  zolpidem (AMBIEN) tablet 5 mg, 5 mg, Oral, QHS PRN, Osborn Coho, MD  Facility-Administered Medications Ordered in Other Encounters:  .  bupivacaine (PF) (MARCAINE) 0.25 % injection, , , Anesthesia Intra-op, Val Eagle, MD, 4 mL at 11/30/14 1348 .  lidocaine-EPINEPHrine (XYLOCAINE W/EPI) 2 %-1:200000 (PF) injection, , Other, Anesthesia Intra-op, Val Eagle, MD, 3 mL at 11/30/14 1340 After visit meds:    Medication List    TAKE these medications        CALCIUM 600 PO  Take 1 tablet by mouth daily.     FISH OIL PO  Take 2 capsules by mouth daily. Patient takes fish oil over the counter but not sure of the mg.2 daily     ibuprofen 600 MG tablet  Commonly known as:  ADVIL,MOTRIN  Take 1 tablet (600 mg total) by mouth every 6 (six) hours.     oxyCODONE-acetaminophen 5-325 MG tablet  Commonly known as:  PERCOCET/ROXICET  Take 2 tablets by mouth every 4 (four) hours as needed (for pain scale greater than 7).     prenatal multivitamin Tabs tablet  Take 1 tablet by mouth daily at 12 noon.        Diet: routine diet  Activity: Advance as tolerated. Pelvic rest for 6 weeks.   Outpatient follow up:2 weeks Follow up Appt:No future appointments. Follow up Visit:No Follow-up on file.  Postpartum contraception: Not Discussed  Newborn Data: Live born female  Birth Weight: 7 lb 9 oz (3430 g) APGAR: 8, 9  Baby Feeding: Bottle and Breast Disposition:home with mother   12/03/2014 Geryl Rankins, MD

## 2014-12-03 NOTE — Lactation Note (Signed)
This note was copied from the chart of [name]. Lactation Consultation Note  Mother is giving formula due to difficulty latching baby and sore nipples. Reviewed hand expression but during expression mother stated it hurt. Did not note breakdown of skin. Discussed supply and demand and mother states she has been pumping w/ DEBP. Unsure of mother's commitment to breastfeeding. Offered to help her latch baby and suggest she call w/ next feeding. Reviewed engorgement care.  Recommend if she wants to provide her baby w/ breastmilk and cannot latch baby she should pump. Briefly discussed WIC options.     Patient Name: Girl Morgan HerrlichBingqing Brinley WUJWJ'XToday's Date: 12/03/2014 Reason for consult: Follow-up assessment   Maternal Data    Feeding Feeding Type: Formula Nipple Type: Slow - flow  LATCH Score/Interventions                      Lactation Tools Discussed/Used     Consult Status Consult Status: Complete    Hardie PulleyBerkelhammer, Ellington Cornia Boschen 12/03/2014, 12:44 PM

## 2016-01-15 LAB — OB RESULTS CONSOLE ABO/RH: RH Type: POSITIVE

## 2016-01-15 LAB — OB RESULTS CONSOLE HIV ANTIBODY (ROUTINE TESTING): HIV: NONREACTIVE

## 2016-01-15 LAB — OB RESULTS CONSOLE ANTIBODY SCREEN: Antibody Screen: NEGATIVE

## 2016-01-15 LAB — OB RESULTS CONSOLE HEPATITIS B SURFACE ANTIGEN: Hepatitis B Surface Ag: NEGATIVE

## 2016-01-15 LAB — OB RESULTS CONSOLE GC/CHLAMYDIA
CHLAMYDIA, DNA PROBE: NEGATIVE
Gonorrhea: NEGATIVE

## 2016-01-15 LAB — OB RESULTS CONSOLE RPR: RPR: NONREACTIVE

## 2016-01-15 LAB — OB RESULTS CONSOLE RUBELLA ANTIBODY, IGM: Rubella: IMMUNE

## 2016-02-02 NOTE — L&D Delivery Note (Signed)
Delivery Note At 2:47 PM a viable female was delivered via Vaginal, Spontaneous Delivery (Presentation:LOA, compound presentation of hands ).  APGAR: 9, 9; weight  pending.   Placenta status: spontaneous, intac .  Cord: no nuchal cord with the following complications: None .  Cord pH: n/a  Anesthesia:  Epidural Episiotomy: None Lacerations: 2nd degree;Perineal Suture Repair: 2.0 3.0 chromic Est. Blood Loss (mL): 350 Dermis avulsed from subcutaneous but not bleeding. 2 cm outpouching lateral to perineal sutureline.  Mom to postpartum.  Baby to Couplet care / Skin to Skin . Outpatient circumcision.  [name], Morgan Ray 06/16/2016, 3:41 PM

## 2016-02-04 ENCOUNTER — Other Ambulatory Visit (HOSPITAL_COMMUNITY): Payer: Self-pay | Admitting: Physician Assistant

## 2016-02-04 DIAGNOSIS — Z3A21 21 weeks gestation of pregnancy: Secondary | ICD-10-CM

## 2016-02-04 DIAGNOSIS — Z3689 Encounter for other specified antenatal screening: Secondary | ICD-10-CM

## 2016-02-05 ENCOUNTER — Ambulatory Visit (HOSPITAL_COMMUNITY)
Admission: RE | Admit: 2016-02-05 | Discharge: 2016-02-05 | Disposition: A | Discharge status: Home / Self Care | Payer: Medicaid Other | Source: Ambulatory Visit | Attending: Obstetrics and Gynecology | Admitting: Obstetrics and Gynecology

## 2016-02-05 DIAGNOSIS — Z363 Encounter for antenatal screening for malformations: Secondary | ICD-10-CM | POA: Diagnosis not present

## 2016-02-05 DIAGNOSIS — Z3A19 19 weeks gestation of pregnancy: Secondary | ICD-10-CM | POA: Diagnosis not present

## 2016-02-05 DIAGNOSIS — Z3A21 21 weeks gestation of pregnancy: Secondary | ICD-10-CM

## 2016-02-05 DIAGNOSIS — Z3689 Encounter for other specified antenatal screening: Secondary | ICD-10-CM

## 2016-04-19 ENCOUNTER — Encounter: Payer: Self-pay | Admitting: *Deleted

## 2016-04-19 ENCOUNTER — Encounter: Payer: Medicaid Other | Attending: Obstetrics and Gynecology | Admitting: *Deleted

## 2016-04-19 DIAGNOSIS — Z713 Dietary counseling and surveillance: Secondary | ICD-10-CM | POA: Insufficient documentation

## 2016-04-19 DIAGNOSIS — O24419 Gestational diabetes mellitus in pregnancy, unspecified control: Secondary | ICD-10-CM | POA: Insufficient documentation

## 2016-04-19 NOTE — Progress Notes (Signed)
  Patient was seen on 04/19/16 for Gestational Diabetes self-management education at the [facility]. The following learning objectives were met by the patient during this appointment:   States the definition of Gestational Diabetes  States why dietary management is important in controlling blood glucose  Describes the effects each nutrient has on blood glucose levels  Demonstrates ability to create a balanced meal plan  Demonstrates carbohydrate counting   States when to check blood glucose levels  Demonstrates proper blood glucose monitoring techniques  States the effect of stress and exercise on blood glucose levels  States the importance of limiting caffeine and abstaining from alcohol and smoking  Blood glucose monitor given: Accu Chek Lot # [id number] Exp: 05/24/17 Blood glucose reading: 96 mg/dl  Patient instructed to monitor glucose levels: FBS: 60 - <90 2 hour: <120  *Patient received handouts:  Nutrition Diabetes and Pregnancy  Carbohydrate Counting List  Patient will be seen for follow-up as needed.

## 2016-06-11 ENCOUNTER — Encounter (HOSPITAL_COMMUNITY): Payer: Self-pay | Admitting: *Deleted

## 2016-06-11 ENCOUNTER — Telehealth (HOSPITAL_COMMUNITY): Payer: Self-pay | Admitting: *Deleted

## 2016-06-11 NOTE — Telephone Encounter (Signed)
Preadmission screen  

## 2016-06-15 ENCOUNTER — Other Ambulatory Visit: Payer: Self-pay | Admitting: Obstetrics and Gynecology

## 2016-06-15 NOTE — H&P (Signed)
Morgan Ray is a 31 y.o. female G2 P1001 @ 39 4/7 weeks revised by a 19 week ultrasound presenting for IOL  Pregnancy has been complicated by GDM diet controlled.  Late PNC.  Short Interval pregnancy. [facility](Kiah Keay). OB History    Gravida Para Term Preterm AB Living   2 1 1     1    SAB TAB Ectopic Multiple Live Births         0 1     Past Medical History:  Diagnosis Date  . Medical history non-contributory    Past Surgical History:  Procedure Laterality Date  . NO PAST SURGERIES     Family History: family history includes Hepatitis B in her father. Social History:  reports that she has never smoked. She has never used smokeless tobacco. She reports that she does not drink alcohol or use drugs.     Maternal Diabetes: GDM diet controlled. Genetic Screening: Declined Maternal Ultrasounds/Referrals: Normal Fetal Ultrasounds or other Referrals:  None Maternal Substance Abuse:  No Significant Maternal Medications:  None Significant Maternal Lab Results:  Lab values include: Group B Strep negative Other Comments:  None  Review of Systems  Gastrointestinal: Negative for abdominal pain.   Maternal Medical History:  Contractions: Frequency: irregular.   Perceived severity is mild.    Fetal activity: Perceived fetal activity is normal.    Prenatal Complications - Diabetes: gestational. Diabetes is managed by diet.        unknown if currently breastfeeding. Maternal Exam:  Abdomen: Patient reports no abdominal tenderness. Estimated fetal weight is 6 lb 10 oz on 05/31/16.   Fetal presentation: vertex  Introitus: Normal vulva. Pelvis: adequate for delivery.   Cervix: Cervix evaluated by digital exam.   2/60/-2  Fetal Exam Fetal Monitor Review: Mode: hand-held doppler probe.   Baseline rate: 140s.      Physical Exam  Constitutional: She is oriented to person, place, and time. She appears well-developed and well-nourished. No distress.  HENT:   Head: Normocephalic and atraumatic.  Eyes: EOM are normal.  Neck: Normal range of motion.  Respiratory: Effort normal and breath sounds normal. No respiratory distress.  GI: There is no tenderness.  Musculoskeletal: Normal range of motion. She exhibits no tenderness.  Neurological: She is alert and oriented to person, place, and time.  Skin: Skin is warm and dry.  Psychiatric: She has a normal mood and affect.    Prenatal labs: ABO, Rh: A/Positive/-- (12/14 0000) Antibody: Negative (12/14 0000) Rubella: Immune (12/14 0000) RPR: Nonreactive (12/14 0000)  HBsAg: Negative (12/14 0000)  HIV: Non-reactive (12/14 0000)  GBS:   Negative  Assessment/Plan: IUP @ 39 4/7 weeks, Multigravida GDM A1 Short Interval Pregnancy Late PNC.  IOL with Cytotec.   Pt counseled on risks. CBGs q 4 hours.   [name], Morgan Ray 06/15/2016, 10:27 PM

## 2016-06-16 ENCOUNTER — Inpatient Hospital Stay (HOSPITAL_COMMUNITY): Payer: Medicaid Other | Admitting: Anesthesiology

## 2016-06-16 ENCOUNTER — Inpatient Hospital Stay (HOSPITAL_COMMUNITY)
Admission: RE | Admit: 2016-06-16 | Discharge: 2016-06-18 | DRG: 775 | Disposition: A | Discharge status: Home / Self Care | Payer: Medicaid Other | Source: Ambulatory Visit | Attending: Obstetrics and Gynecology | Admitting: Obstetrics and Gynecology

## 2016-06-16 ENCOUNTER — Encounter (HOSPITAL_COMMUNITY): Payer: Self-pay

## 2016-06-16 DIAGNOSIS — O24420 Gestational diabetes mellitus in childbirth, diet controlled: Principal | ICD-10-CM | POA: Diagnosis present

## 2016-06-16 DIAGNOSIS — O24419 Gestational diabetes mellitus in pregnancy, unspecified control: Secondary | ICD-10-CM | POA: Diagnosis present

## 2016-06-16 DIAGNOSIS — Z3A39 39 weeks gestation of pregnancy: Secondary | ICD-10-CM | POA: Diagnosis not present

## 2016-06-16 DIAGNOSIS — O326XX0 Maternal care for compound presentation, not applicable or unspecified: Secondary | ICD-10-CM | POA: Diagnosis present

## 2016-06-16 LAB — TYPE AND SCREEN
ABO/RH(D): A POS
Antibody Screen: NEGATIVE

## 2016-06-16 LAB — COMPREHENSIVE METABOLIC PANEL
ALBUMIN: 2.8 g/dL — AB (ref 3.5–5.0)
ALK PHOS: 172 U/L — AB (ref 38–126)
ALT: 17 U/L (ref 14–54)
AST: 24 U/L (ref 15–41)
Anion gap: 6 (ref 5–15)
BILIRUBIN TOTAL: 0.8 mg/dL (ref 0.3–1.2)
BUN: 13 mg/dL (ref 6–20)
CHLORIDE: 107 mmol/L (ref 101–111)
CO2: 23 mmol/L (ref 22–32)
Calcium: 8.4 mg/dL — ABNORMAL LOW (ref 8.9–10.3)
Creatinine, Ser: 0.4 mg/dL — ABNORMAL LOW (ref 0.44–1.00)
GFR calc Af Amer: >60 mL/min (ref >60)
GLUCOSE: 79 mg/dL (ref 65–99)
POTASSIUM: 3.7 mmol/L (ref 3.5–5.1)
Sodium: 136 mmol/L (ref 135–145)
TOTAL PROTEIN: 5.7 g/dL — AB (ref 6.5–8.1)

## 2016-06-16 LAB — CBC
HCT: 38.3 % (ref 36.0–46.0)
Hemoglobin: 13.4 g/dL (ref 12.0–15.0)
MCH: 33.9 pg (ref 26.0–34.0)
MCHC: 35 g/dL (ref 30.0–36.0)
MCV: 97 fL (ref 78.0–100.0)
PLATELETS: 131 10*3/uL — AB (ref 150–400)
RBC: 3.95 MIL/uL (ref 3.87–5.11)
RDW: 13.2 % (ref 11.5–15.5)
WBC: 6.4 10*3/uL (ref 4.0–10.5)

## 2016-06-16 LAB — RPR: RPR: NONREACTIVE

## 2016-06-16 LAB — GLUCOSE, CAPILLARY
GLUCOSE-CAPILLARY: 82 mg/dL (ref 65–99)
Glucose-Capillary: 77 mg/dL (ref 65–99)

## 2016-06-16 MED ORDER — LIDOCAINE HCL (PF) 1 % IJ SOLN
30.0000 mL | INTRAMUSCULAR | Status: DC | PRN
  Filled 2016-06-16: qty 30

## 2016-06-16 MED ORDER — DIBUCAINE 1 % RE OINT: 1.0000 "application " | TOPICAL_OINTMENT | RECTAL | Status: DC | PRN

## 2016-06-16 MED ORDER — METHYLERGONOVINE MALEATE 0.2 MG/ML IJ SOLN
0.2000 mg | Freq: Once | INTRAMUSCULAR | Status: AC
  Administered 2016-06-16: 0.2 mg via INTRAMUSCULAR

## 2016-06-16 MED ORDER — OXYCODONE-ACETAMINOPHEN 5-325 MG PO TABS: 2.0000 | ORAL_TABLET | ORAL | Status: DC | PRN

## 2016-06-16 MED ORDER — OXYCODONE-ACETAMINOPHEN 5-325 MG PO TABS: 1.0000 | ORAL_TABLET | ORAL | Status: DC | PRN

## 2016-06-16 MED ORDER — DIPHENHYDRAMINE HCL 50 MG/ML IJ SOLN: 12.5000 mg | INTRAMUSCULAR | Status: DC | PRN

## 2016-06-16 MED ORDER — METHYLERGONOVINE MALEATE 0.2 MG PO TABS: 0.2000 mg | ORAL_TABLET | ORAL | Status: DC | PRN

## 2016-06-16 MED ORDER — ONDANSETRON HCL 4 MG PO TABS: 4.0000 mg | ORAL_TABLET | ORAL | Status: DC | PRN

## 2016-06-16 MED ORDER — LACTATED RINGERS IV SOLN: 500.0000 mL | INTRAVENOUS | Status: DC | PRN

## 2016-06-16 MED ORDER — FENTANYL 2.5 MCG/ML BUPIVACAINE 1/10 % EPIDURAL INFUSION (WH - ANES)
14.0000 mL/h | INTRAMUSCULAR | Status: DC | PRN
  Administered 2016-06-16: 14 mL/h via EPIDURAL
  Filled 2016-06-16: qty 100

## 2016-06-16 MED ORDER — BUTORPHANOL TARTRATE 1 MG/ML IJ SOLN: 1.0000 mg | INTRAMUSCULAR | Status: DC | PRN

## 2016-06-16 MED ORDER — SOD CITRATE-CITRIC ACID 500-334 MG/5ML PO SOLN: 30.0000 mL | ORAL | Status: DC | PRN

## 2016-06-16 MED ORDER — SIMETHICONE 80 MG PO CHEW: 80.0000 mg | CHEWABLE_TABLET | ORAL | Status: DC | PRN

## 2016-06-16 MED ORDER — PHENYLEPHRINE 40 MCG/ML (10ML) SYRINGE FOR IV PUSH (FOR BLOOD PRESSURE SUPPORT): 80.0000 ug | PREFILLED_SYRINGE | INTRAVENOUS | Status: DC | PRN

## 2016-06-16 MED ORDER — BENZOCAINE-MENTHOL 20-0.5 % EX AERO
1.0000 "application " | INHALATION_SPRAY | CUTANEOUS | Status: DC | PRN
  Administered 2016-06-16: 1 via TOPICAL

## 2016-06-16 MED ORDER — FENTANYL 2.5 MCG/ML BUPIVACAINE 1/10 % EPIDURAL INFUSION (WH - ANES): 2.5000 mL/h | INTRAMUSCULAR | Status: DC | PRN

## 2016-06-16 MED ORDER — ACETAMINOPHEN 325 MG PO TABS
650.0000 mg | ORAL_TABLET | ORAL | Status: DC | PRN
  Administered 2016-06-16: 650 mg via ORAL
  Filled 2016-06-16: qty 2

## 2016-06-16 MED ORDER — ZOLPIDEM TARTRATE 5 MG PO TABS: 5.0000 mg | ORAL_TABLET | Freq: Every evening | ORAL | Status: DC | PRN

## 2016-06-16 MED ORDER — OXYTOCIN 40 UNITS IN LACTATED RINGERS INFUSION - SIMPLE MED
1.0000 m[IU]/min | INTRAVENOUS | Status: DC
Start: 2016-06-16 — End: 2016-06-16
  Administered 2016-06-16: 1 m[IU]/min via INTRAVENOUS
  Filled 2016-06-16: qty 1000

## 2016-06-16 MED ORDER — ACETAMINOPHEN 325 MG PO TABS: 650.0000 mg | ORAL_TABLET | ORAL | Status: DC | PRN

## 2016-06-16 MED ORDER — MISOPROSTOL 25 MCG QUARTER TABLET
25.0000 ug | ORAL_TABLET | ORAL | Status: DC | PRN
  Administered 2016-06-16: 25 ug via VAGINAL
  Filled 2016-06-16 (×2): qty 1

## 2016-06-16 MED ORDER — EPHEDRINE 5 MG/ML INJ: 10.0000 mg | INTRAVENOUS | Status: DC | PRN

## 2016-06-16 MED ORDER — SENNOSIDES-DOCUSATE SODIUM 8.6-50 MG PO TABS
2.0000 | ORAL_TABLET | ORAL | Status: DC
  Administered 2016-06-17 – 2016-06-18 (×2): 2 via ORAL
  Filled 2016-06-16 (×2): qty 2

## 2016-06-16 MED ORDER — DIPHENHYDRAMINE HCL 25 MG PO CAPS: 25.0000 mg | ORAL_CAPSULE | Freq: Four times a day (QID) | ORAL | Status: DC | PRN

## 2016-06-16 MED ORDER — OXYTOCIN 40 UNITS IN LACTATED RINGERS INFUSION - SIMPLE MED: 2.5000 [IU]/h | INTRAVENOUS | Status: DC

## 2016-06-16 MED ORDER — OXYTOCIN BOLUS FROM INFUSION
500.0000 mL | Freq: Once | INTRAVENOUS | Status: AC
  Administered 2016-06-16: 500 mL via INTRAVENOUS

## 2016-06-16 MED ORDER — IBUPROFEN 600 MG PO TABS
600.0000 mg | ORAL_TABLET | Freq: Four times a day (QID) | ORAL | Status: DC
  Administered 2016-06-16 – 2016-06-18 (×8): 600 mg via ORAL
  Filled 2016-06-16 (×8): qty 1

## 2016-06-16 MED ORDER — TERBUTALINE SULFATE 1 MG/ML IJ SOLN
0.2500 mg | Freq: Once | INTRAMUSCULAR | Status: DC | PRN
  Filled 2016-06-16: qty 1

## 2016-06-16 MED ORDER — ONDANSETRON HCL 4 MG/2ML IJ SOLN: 4.0000 mg | INTRAMUSCULAR | Status: DC | PRN

## 2016-06-16 MED ORDER — PHENYLEPHRINE 40 MCG/ML (10ML) SYRINGE FOR IV PUSH (FOR BLOOD PRESSURE SUPPORT)
80.0000 ug | PREFILLED_SYRINGE | INTRAVENOUS | Status: DC | PRN
  Filled 2016-06-16: qty 5
  Filled 2016-06-16: qty 10

## 2016-06-16 MED ORDER — METHYLERGONOVINE MALEATE 0.2 MG/ML IJ SOLN
INTRAMUSCULAR | Status: AC
  Administered 2016-06-16: 0.2 mg via INTRAMUSCULAR
  Filled 2016-06-16: qty 1

## 2016-06-16 MED ORDER — LIDOCAINE HCL (PF) 1 % IJ SOLN
INTRAMUSCULAR | Status: DC | PRN
  Administered 2016-06-16: 13 mL via EPIDURAL

## 2016-06-16 MED ORDER — METHYLERGONOVINE MALEATE 0.2 MG/ML IJ SOLN
0.2000 mg | INTRAMUSCULAR | Status: DC | PRN
Start: 2016-06-16 — End: 2016-06-18

## 2016-06-16 MED ORDER — EPHEDRINE 5 MG/ML INJ
10.0000 mg | INTRAVENOUS | Status: DC | PRN
  Filled 2016-06-16: qty 2

## 2016-06-16 MED ORDER — LACTATED RINGERS IV SOLN: 500.0000 mL | Freq: Once | INTRAVENOUS | Status: DC

## 2016-06-16 MED ORDER — LACTATED RINGERS IV SOLN
500.0000 mL | Freq: Once | INTRAVENOUS | Status: AC
  Administered 2016-06-16: 500 mL via INTRAVENOUS

## 2016-06-16 MED ORDER — PHENYLEPHRINE 40 MCG/ML (10ML) SYRINGE FOR IV PUSH (FOR BLOOD PRESSURE SUPPORT)
80.0000 ug | PREFILLED_SYRINGE | INTRAVENOUS | Status: DC | PRN
  Filled 2016-06-16: qty 5

## 2016-06-16 MED ORDER — LACTATED RINGERS IV SOLN
INTRAVENOUS | Status: DC
  Administered 2016-06-16: 07:00:00 via INTRAVENOUS
  Administered 2016-06-16: 125 mL/h via INTRAVENOUS
  Administered 2016-06-16: 03:00:00 via INTRAVENOUS

## 2016-06-16 MED ORDER — WITCH HAZEL-GLYCERIN EX PADS: 1.0000 "application " | MEDICATED_PAD | CUTANEOUS | Status: DC | PRN

## 2016-06-16 MED ORDER — TETANUS-DIPHTH-ACELL PERTUSSIS 5-2.5-18.5 LF-MCG/0.5 IM SUSP: 0.5000 mL | Freq: Once | INTRAMUSCULAR | Status: DC

## 2016-06-16 MED ORDER — COCONUT OIL OIL: 1.0000 "application " | TOPICAL_OIL | Status: DC | PRN

## 2016-06-16 MED ORDER — HYDROXYZINE HCL 50 MG PO TABS
50.0000 mg | ORAL_TABLET | Freq: Four times a day (QID) | ORAL | Status: DC | PRN
  Filled 2016-06-16: qty 1

## 2016-06-16 MED ORDER — MAGNESIUM HYDROXIDE 400 MG/5ML PO SUSP: 30.0000 mL | ORAL | Status: DC | PRN

## 2016-06-16 MED ORDER — ONDANSETRON HCL 4 MG/2ML IJ SOLN: 4.0000 mg | Freq: Four times a day (QID) | INTRAMUSCULAR | Status: DC | PRN

## 2016-06-16 MED ORDER — PRENATAL MULTIVITAMIN CH
1.0000 | ORAL_TABLET | Freq: Every day | ORAL | Status: DC
  Administered 2016-06-17 – 2016-06-18 (×2): 1 via ORAL
  Filled 2016-06-16 (×2): qty 1

## 2016-06-16 NOTE — Anesthesia Procedure Notes (Signed)
Epidural Patient location during procedure: OB Start time: 06/16/2016 11:01 AM End time: 06/16/2016 11:11 AM  Staffing Anesthesiologist: Anitra LauthMILLER, Kajuan Guyton RAY Performed: anesthesiologist   Preanesthetic Checklist Completed: patient identified, site marked, surgical consent, pre-op evaluation, timeout performed, IV checked, risks and benefits discussed and monitors and equipment checked  Epidural Patient position: sitting Prep: DuraPrep Patient monitoring: heart rate, cardiac monitor, continuous pulse ox and blood pressure Approach: midline Location: L2-L3 Injection technique: LOR saline  Needle:  Needle type: Tuohy  Needle gauge: 17 G Needle length: 9 cm Needle insertion depth: 4 cm Catheter type: closed end flexible Catheter size: 20 Guage Catheter at skin depth: 7 cm Test dose: negative  Assessment Events: blood not aspirated, injection not painful, no injection resistance, negative IV test and no paresthesia  Additional Notes Reason for block:procedure for pain

## 2016-06-16 NOTE — Progress Notes (Signed)
Morgan Ray is a 31 y.o. G2P1001 at 8031w6d    Subjective: Pt states she is feeling contractions.  Does not want an epidural  Objective: BP 107/73   Pulse 65   Temp 97.7 F (36.5 C) (Oral)   Resp 18   Ht 5\' 6"  (1.676 m)   Wt 69.9 kg (154 lb)   BMI 24.86 kg/m  No intake/output data recorded. No intake/output data recorded.  FHT:  FHR: 120s bpm, variability: moderate,  accelerations:  Present,  decelerations:  Absent UC:   irregular, every 2-6 minutes SVE:   Dilation: 4 Effacement (%): 80 Station: -2 Exam by:: Kris HartmannNicole Jones, RN  Labs: Lab Results  Component Value Date   WBC 6.4 06/16/2016   HGB 13.4 06/16/2016   HCT 38.3 06/16/2016   MCV 97.0 06/16/2016   PLT 131 (L) 06/16/2016    Assessment / Plan: Induction of labor due to gestational diabetes,  progressing well on pitocin  Labor: S/p Cytotec x 1 Preeclampsia:  BP normal.  Hg 13 and platelets 131.  Check LFTs. Fetal Wellbeing:  Category I Pain Control:  Epidural I/D:  n/a Anticipated MOD:  NSVD  [name] 06/16/2016, 8:22 AM

## 2016-06-16 NOTE — Anesthesia Preprocedure Evaluation (Signed)
Anesthesia Evaluation  Patient identified by MRN, date of birth, ID band Patient awake    Reviewed: Allergy & Precautions, Patient's Chart, lab work & pertinent test results  History of Anesthesia Complications Negative for: history of anesthetic complications  Airway Mallampati: I  TM Distance: >3 FB Neck ROM: Full    Dental  (+) Teeth Intact   Pulmonary neg pulmonary ROS,  breath sounds clear to auscultation        Cardiovascular negative cardio ROS  Rhythm:Regular     Neuro/Psych negative neurological ROS  negative psych ROS   GI/Hepatic negative GI ROS, Neg liver ROS,   Endo/Other  negative endocrine ROS  Renal/GU negative Renal ROS     Musculoskeletal   Abdominal   Peds  Hematology negative hematology ROS (+)   Anesthesia Other Findings   Reproductive/Obstetrics (+) Pregnancy                             Anesthesia Physical Anesthesia Plan  ASA: II  Anesthesia Plan: Epidural   Post-op Pain Management:    Induction:   Airway Management Planned:   Additional Equipment:   Intra-op Plan:   Post-operative Plan:   Informed Consent: I have reviewed the patients History and Physical, chart, labs and discussed the procedure including the risks, benefits and alternatives for the proposed anesthesia with the patient or authorized representative who has indicated his/her understanding and acceptance.   Dental advisory given  Plan Discussed with: Anesthesiologist  Anesthesia Plan Comments:         Anesthesia Quick Evaluation  

## 2016-06-16 NOTE — Progress Notes (Signed)
Pt resting comfortably s/p epidural.   AFVSS Gen:  NAD, sleeping quietly Cervix 5/70/-2 AROM Clear Cat I tracing, Baseline 120s CBG 77, 82  A/P IUP @ 39 6/7 weeks GDM A1-Blood glucose normal Continue Pitocin. Anticipate SVD.

## 2016-06-16 NOTE — Anesthesia Pain Management Evaluation Note (Signed)
  CRNA Pain Management Visit Note  Patient: Morgan Ray, 31 y.o., female  "Hello I am a [facility]. We have an anesthesia team available at all times to provide care throughout the hospital, including epidural management and anesthesia for C-section. I don't know your plan for the delivery whether it a natural birth, water birth, IV sedation, nitrous supplementation, doula or epidural, but we want to meet your pain goals."   1.Was your pain managed to your expectations on prior hospitalizations?   Yes   2.What is your expectation for pain management during this hospitalization?     Epidural  3.How can we help you reach that goal? Getting epidural now  Record the patient's initial score and the patient's pain goal.   Pain: getting epidural  Pain Goal: getting epidural [facility] wants you to be able to say your pain was always managed very well.  Rica RecordsICKELTON,Tara Rud 06/16/2016

## 2016-06-17 ENCOUNTER — Inpatient Hospital Stay (HOSPITAL_COMMUNITY): Admission: RE | Admit: 2016-06-17 | Payer: Self-pay | Source: Ambulatory Visit

## 2016-06-17 ENCOUNTER — Inpatient Hospital Stay (HOSPITAL_COMMUNITY): Admission: AD | Admit: 2016-06-17 | Payer: Self-pay | Source: Ambulatory Visit | Admitting: Obstetrics and Gynecology

## 2016-06-17 LAB — CBC
HEMATOCRIT: 34.5 % — AB (ref 36.0–46.0)
Hemoglobin: 12.3 g/dL (ref 12.0–15.0)
MCH: 34.5 pg — AB (ref 26.0–34.0)
MCHC: 35.7 g/dL (ref 30.0–36.0)
MCV: 96.6 fL (ref 78.0–100.0)
Platelets: 143 10*3/uL — ABNORMAL LOW (ref 150–400)
RBC: 3.57 MIL/uL — ABNORMAL LOW (ref 3.87–5.11)
RDW: 13.2 % (ref 11.5–15.5)
WBC: 9.5 10*3/uL (ref 4.0–10.5)

## 2016-06-17 NOTE — Discharge Instructions (Signed)

## 2016-06-17 NOTE — Anesthesia Postprocedure Evaluation (Signed)
Anesthesia Post Note  Patient: Morgan Ray  Procedure(s) Performed: * No procedures listed *  Patient location during evaluation: Mother Baby Anesthesia Type: Epidural Level of consciousness: awake and alert and oriented Pain management: satisfactory to patient Vital Signs Assessment: post-procedure vital signs reviewed and stable Respiratory status: spontaneous breathing and nonlabored ventilation Cardiovascular status: stable Postop Assessment: no headache, no backache, no signs of nausea or vomiting, adequate PO intake and patient able to bend at knees (patient up walking) Anesthetic complications: no        Last Vitals:  Vitals:   06/16/16 2150 06/17/16 0500  BP: 115/63 (!) 92/59  Pulse: (!) 59 60  Resp: 20 16  Temp: 36.7 C 36.4 C    Last Pain:  Vitals:   06/17/16 1153  TempSrc:   PainSc: 0-No pain   Pain Goal: Patients Stated Pain Goal: 0 (06/16/16 1817)               Madison HickmanGREGORY,Tabbatha Bordelon

## 2016-06-17 NOTE — Progress Notes (Signed)
Postpartum Note Day # 1  S:  Patient resting comfortable in bed.  Pain controlled.  Tolerating general diet. + flatus, + BM.  Lochia appropriate.  Ambulating without difficulty.  She denies n/v/f/c, SOB, or CP.  Pt plans on breastfeeding.  O: Temp:  [97.6 F (36.4 C)-98.2 F (36.8 C)] 97.6 F (36.4 C) (05/17 0500) Pulse Rate:  [58-103] 60 (05/17 0500) Resp:  [16-20] 16 (05/17 0500) BP: (80-132)/(44-84) 92/59 (05/17 0500) SpO2:  [98 %-100 %] 100 % (05/16 2150)   Gen: A&Ox3, NAD CV: RRR, no MRG Resp: CTAB Abdomen: soft, NT, ND Uterus: firm, non-tender, below umbilicus Incision: c/d/i, bandage on Ext: No edema, no calf tenderness bilaterally, SCDs in place  Labs:  CBC Latest Ref Rng & Units 06/16/2016 12/02/2014 11/30/2014  WBC 4.0 - 10.5 K/uL 6.4 9.4 5.7  Hemoglobin 12.0 - 15.0 g/dL 16.113.4 10.2(L) 12.0  Hematocrit 36.0 - 46.0 % 38.3 29.4(L) 34.2(L)  Platelets 150 - 400 K/uL 131(L) 101(L) 119(L)    A/P: Pt is a 31 y.o. G2P2002 s/p NSVD, PPD#1  - Pain well controlled -GU: voiding freely -GI: Tolerating general diet -Activity: encouraged sitting up to chair and ambulation as tolerated -Prophylaxis: early ambulation -Labs: pending this am  DISPO: continue with routine postpartum care, plan for discharge home tomorrow  Myna HidalgoJennifer Rex Magee, DO [phone]) [phone] (office)

## 2016-06-18 MED ORDER — IBUPROFEN 600 MG PO TABS: 600.0000 mg | ORAL_TABLET | Freq: Four times a day (QID) | ORAL | 0 refills | Status: AC

## 2016-06-18 NOTE — Progress Notes (Signed)
CSW received consult for "depression in pregnancy."  CSW reviewed MOB's chart, which does not have documentation of depression.  CSW met with parents in MOB's first floor room/109 to offer support and complete assessment.  Parents were quiet, but friendly and welcoming.  FOB was providing care to infant while CSW spoke with MOB.  MOB gave permission to speak openly with her husband present.  FOB appears calm and comfortable with infant care.   MOB states she is feeling "good" about having another baby and smiled as she spoke.  She states this is her second child and that they have an 6 month old at home.  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  MOB states she plans to take 1 month off from the restaurant and then her mother will care for the children in order for her to return to work.  MOB denies any depressive symptoms currently or in the past.  She states no hx of PMADs after first baby.  She was engaged and attentive to education regarding PMADs offered by CSW and reports no questions, concerns or needs.  CSW provided her with information about support groups held at [facility] and with a New Mom Checklist as a way to self-evaluate during the postpartum period.  MOB smiled and thanked CSW.  CSW identifies no further need for intervention or barriers to discharge.

## 2016-06-18 NOTE — Discharge Summary (Addendum)
OB Discharge Summary     Patient Name: Morgan Ray DOB: 1985/08/07 MRN: [id number]  Date of admission: 06/16/2016 Delivering MD: [name], Tagen Brethauer   Date of discharge: 06/18/2016  Admitting diagnosis: INDUCTION Intrauterine pregnancy: 6562w6d     Secondary diagnosis:  Principal Problem:   Vaginal delivery Active Problems:   Gestational diabetes  Additional problems: None     Discharge diagnosis: Term Pregnancy Delivered                                                                                                Post partum procedures:None  Augmentation: AROM, Pitocin and Cytotec  Complications: None  Hospital course:  Induction of Labor With Vaginal Delivery   31 y.o. yo G2P2002 at 2762w6d was admitted to the hospital 06/16/2016 for induction of labor.  Indication for induction: A1 DM.  Patient had an uncomplicated labor course as follows: Membrane Rupture Time/Date: 1:03 PM ,06/16/2016   Intrapartum Procedures: Episiotomy: None [1]                                         Lacerations:  2nd degree [3];Perineal [11]  Patient had delivery of a Viable infant.  Information for the patient's newborn:  Betha LoaLu, Boy Capria [[id number]][[id number]]  Delivery Method: Vaginal, Spontaneous Delivery (Filed from Delivery Summary)   06/16/2016  Details of delivery can be found in separate delivery note.  Patient had a routine postpartum course. Patient is discharged home 06/18/16.  Physical exam  Vitals:   06/16/16 2150 06/17/16 0500 06/17/16 1840 06/18/16 0532  BP: 115/63 (!) 92/59 (!) 93/56 (!) 106/59  Pulse: (!) 59 60 74 68  Resp: 20 16 16 16   Temp: 98 F (36.7 C) 97.6 F (36.4 C) 97.9 F (36.6 C) 97.8 F (36.6 C)  TempSrc: Oral Oral Axillary Oral  SpO2: 100%  97% 99%  Weight:      Height:       General: alert, cooperative and no distress Lochia: appropriate Uterine Fundus: firm Incision: N/A DVT Evaluation: No evidence of DVT seen on physical exam. Calf/Ankle edema is present Labs: Lab  Results  Component Value Date   WBC 9.5 06/17/2016   HGB 12.3 06/17/2016   HCT 34.5 (L) 06/17/2016   MCV 96.6 06/17/2016   PLT 143 (L) 06/17/2016   CMP Latest Ref Rng & Units 06/16/2016  Glucose 65 - 99 mg/dL 79  BUN 6 - 20 mg/dL 13  Creatinine 7.820.44 - 9.561.00 mg/dL 2.13(Y0.40(L)  Sodium 865135 - 784145 mmol/L 136  Potassium 3.5 - 5.1 mmol/L 3.7  Chloride 101 - 111 mmol/L 107  CO2 22 - 32 mmol/L 23  Calcium 8.9 - 10.3 mg/dL 6.9(G8.4(L)  Total Protein 6.5 - 8.1 g/dL 2.9(B5.7(L)  Total Bilirubin 0.3 - 1.2 mg/dL 0.8  Alkaline Phos 38 - 126 U/L 172(H)  AST 15 - 41 U/L 24  ALT 14 - 54 U/L 17    Discharge instruction: per After Visit Summary and "Baby and Me Booklet".  After visit meds:  Allergies as of 06/18/2016   No Known Allergies     Medication List    TAKE these medications   CALCIUM 600 PO Take 1 tablet by mouth daily.   FISH OIL PO Take 2 capsules by mouth daily. Patient takes fish oil over the counter but not sure of the mg.2 daily   ibuprofen 600 MG tablet Commonly known as:  ADVIL,MOTRIN Take 1 tablet (600 mg total) by mouth every 6 (six) hours.   prenatal multivitamin Tabs tablet Take 1 tablet by mouth daily at 12 noon.       Diet: routine diet  Activity: Advance as tolerated. Pelvic rest for 6 weeks.   Outpatient follow up:2 weeks Follow up Appt:No future appointments. Follow up Visit:No Follow-up on file.  Postpartum contraception: Undecided  Newborn Data: Live born female  Birth Weight: 7 lb 4.9 oz (3315 g) APGAR: 9, 9  Baby Feeding: Bottle and Breast Disposition:home with mother Outpatient circumcision.  06/18/2016 Geryl Rankins, MD

## 2017-09-23 ENCOUNTER — Other Ambulatory Visit: Payer: Self-pay | Admitting: Obstetrics and Gynecology

## 2017-09-23 ENCOUNTER — Other Ambulatory Visit (HOSPITAL_COMMUNITY)
Admission: RE | Admit: 2017-09-23 | Discharge: 2017-09-23 | Disposition: A | Discharge status: Home / Self Care | Payer: BLUE CROSS/BLUE SHIELD | Source: Ambulatory Visit | Attending: Obstetrics and Gynecology | Admitting: Obstetrics and Gynecology

## 2017-09-23 DIAGNOSIS — Z01411 Encounter for gynecological examination (general) (routine) with abnormal findings: Secondary | ICD-10-CM | POA: Insufficient documentation

## 2017-09-27 LAB — CYTOLOGY - PAP
Diagnosis: NEGATIVE
HPV (WINDOPATH): NOT DETECTED

## 2019-07-11 ENCOUNTER — Ambulatory Visit (INDEPENDENT_AMBULATORY_CARE_PROVIDER_SITE_OTHER): Payer: 59 | Admitting: Family Medicine

## 2019-07-11 ENCOUNTER — Encounter: Payer: Self-pay | Admitting: Family Medicine

## 2019-07-11 ENCOUNTER — Other Ambulatory Visit (HOSPITAL_COMMUNITY)
Admission: RE | Admit: 2019-07-11 | Discharge: 2019-07-11 | Disposition: A | Discharge status: Home / Self Care | Payer: 59 | Source: Ambulatory Visit | Attending: Family Medicine | Admitting: Family Medicine

## 2019-07-11 ENCOUNTER — Other Ambulatory Visit: Payer: Self-pay

## 2019-07-11 VITALS — BP 100/70 | HR 71 | Temp 97.7°F | Ht 66.0 in | Wt 135.8 lb

## 2019-07-11 DIAGNOSIS — Z124 Encounter for screening for malignant neoplasm of cervix: Secondary | ICD-10-CM

## 2019-07-11 DIAGNOSIS — Z1321 Encounter for screening for nutritional disorder: Secondary | ICD-10-CM | POA: Diagnosis not present

## 2019-07-11 DIAGNOSIS — M545 Low back pain, unspecified: Secondary | ICD-10-CM

## 2019-07-11 DIAGNOSIS — Z0000 Encounter for general adult medical examination without abnormal findings: Secondary | ICD-10-CM

## 2019-07-11 DIAGNOSIS — Z8632 Personal history of gestational diabetes: Secondary | ICD-10-CM

## 2019-07-11 DIAGNOSIS — G8929 Other chronic pain: Secondary | ICD-10-CM

## 2019-07-11 NOTE — Progress Notes (Signed)
Morgan Ray is a 34 y.o. female  Chief Complaint  Patient presents with  . Establish Care    Pt here for a physical and she is fasting for labs.   Pt c/o lower back pain , pt is asking for pap smear today.    HPI: Morgan Ray is a 34 y.o. female here as a new patient for annual CPE, fasting labs, PAP. 2 young children, married. Pt has an interpreter who translates during this encounter.  She complains of low back pain x 5 years. She fell years ago on ice but then she did not have any issues with pain until she was pregnant with her first child. Pain at night or if she stands or walks for long period of time. She has not tried taking anything. Xray done 4 years ago and saw chiropractor x 1 year without significant improvement.   Last PAP: unsure, about 3 years ago  Diet/Exercise: balanced diet, no regular exercise but active with 2 young children Dental: overdue Vision: wears contacts and UTD on exam  Med refills needed today? n/a  Last menstrual cycle: 06/18/19  Past Medical History:  Diagnosis Date  . Medical history non-contributory     Past Surgical History:  Procedure Laterality Date  . child birth  2016 and 2018  . NO PAST SURGERIES      Social History   Socioeconomic History  . Marital status: Single    Spouse name: Not on file  . Number of children: Not on file  . Years of education: Not on file  . Highest education level: Not on file  Occupational History  . Not on file  Tobacco Use  . Smoking status: Never Smoker  . Smokeless tobacco: Never Used  Substance and Sexual Activity  . Alcohol use: No  . Drug use: No  . Sexual activity: Yes  Other Topics Concern  . Not on file  Social History Narrative  . Not on file   Social Determinants of Health   Financial Resource Strain:   . Difficulty of Paying Living Expenses:   Food Insecurity:   . Worried About Programme researcher, broadcasting/film/video in the Last Year:   . Barista in the Last Year:   Transportation  Needs:   . Freight forwarder (Medical):   Marland Kitchen Lack of Transportation (Non-Medical):   Physical Activity:   . Days of Exercise per Week:   . Minutes of Exercise per Session:   Stress:   . Feeling of Stress :   Social Connections:   . Frequency of Communication with Friends and Family:   . Frequency of Social Gatherings with Friends and Family:   . Attends Religious Services:   . Active Member of Clubs or Organizations:   . Attends Banker Meetings:   Marland Kitchen Marital Status:   Intimate Partner Violence:   . Fear of Current or Ex-Partner:   . Emotionally Abused:   Marland Kitchen Physically Abused:   . Sexually Abused:     Family History  Problem Relation Age of Onset  . Hepatitis B Father      There is no immunization history for the selected administration types on file for this patient.  Outpatient Encounter Medications as of 07/11/2019  Medication Sig  . Calcium Carbonate (CALCIUM 600 PO) Take 1 tablet by mouth daily.  Marland Kitchen ibuprofen (ADVIL,MOTRIN) 600 MG tablet Take 1 tablet (600 mg total) by mouth every 6 (six) hours. (Patient not taking: Reported on 07/11/2019)  .  Omega-3 Fatty Acids (FISH OIL PO) Take 2 capsules by mouth daily. Patient takes fish oil over the counter but not sure of the mg.2 daily  . Prenatal Vit-Fe Fumarate-FA (PRENATAL MULTIVITAMIN) TABS tablet Take 1 tablet by mouth daily at 12 noon.   No facility-administered encounter medications on file as of 07/11/2019.     ROS: Pertinent positives and negatives noted in HPI. Remainder of ROS non-contributory Denies HA, vision changes, dizziness, CP, SOB, n/v/d/c, LE edema, anxiety, depression   No Known Allergies  BP 100/70 (BP Location: Left Arm, Patient Position: Sitting, Cuff Size: Normal)   Pulse 71   Temp 97.7 F (36.5 C) (Temporal)   Ht 5\' 6"  (1.676 m)   Wt 135 lb 12.8 oz (61.6 kg)   LMP 06/18/2019   SpO2 99%   BMI 21.92 kg/m   Physical Exam  Constitutional: She is oriented to person, place, and time.  She appears well-developed and well-nourished. No distress.  HENT:  Head: Normocephalic and atraumatic.  Right Ear: Tympanic membrane and ear canal normal.  Left Ear: Tympanic membrane and ear canal normal.  Nose: Nose normal.  Mouth/Throat: Oropharynx is clear and moist and mucous membranes are normal.  Eyes: Pupils are equal, round, and reactive to light. Conjunctivae are normal.  Neck: No thyromegaly present.  Cardiovascular: Normal rate, regular rhythm, normal heart sounds and intact distal pulses.  No murmur heard. Pulmonary/Chest: Effort normal and breath sounds normal. No respiratory distress. She has no wheezes. She has no rhonchi. Right breast exhibits no mass, no nipple discharge, no skin change and no tenderness. Left breast exhibits no mass, no nipple discharge, no skin change and no tenderness.  Abdominal: Soft. Bowel sounds are normal. She exhibits no distension and no mass. There is no abdominal tenderness.  Genitourinary:    Vagina and uterus normal.  There is no rash, tenderness or lesion on the right labia. There is no rash, tenderness or lesion on the left labia. Cervix exhibits no motion tenderness, no discharge and no friability. Right adnexum displays no mass, no tenderness and no fullness. Left adnexum displays no mass, no tenderness and no fullness.  Musculoskeletal:        General: No edema.     Cervical back: Neck supple.     Lumbar back: No swelling, deformity, spasms or tenderness. Normal range of motion.  Lymphadenopathy:    She has no cervical adenopathy.  Neurological: She is alert and oriented to person, place, and time. She exhibits normal muscle tone. Coordination normal.  Skin: Skin is warm and dry.  Psychiatric: She has a normal mood and affect. Her behavior is normal.     A/P:  1. Annual physical exam - PAP today - UTD on eye exam, overdue for dental exam and recommended pt schedule - discussed importance of regular CV exercise, healthy diet,  adequate sleep - VITAMIN D 25 Hydroxy (Vit-D Deficiency, Fractures) - Lipid panel - CBC - Basic metabolic panel - AST - ALT - next CPE in 1 year  2. History of gestational diabetes - Hemoglobin A1c  3. Encounter for vitamin deficiency screening - VITAMIN D 25 Hydroxy (Vit-D Deficiency, Fractures)  4. Screening for cervical cancer - Cytology - PAP( [place])  5. Chronic bilateral low back pain without sciatica - 5 years, unchanged - pt saw chiropractor x 1 year w/o significant improvement, neg xray about 3-4 years ago - Ambulatory referral to Physical Therapy   This visit occurred during the SARS-CoV-2 public health emergency.  Safety protocols were in place, including screening questions prior to the visit, additional usage of staff PPE, and extensive cleaning of exam room while observing appropriate contact time as indicated for disinfecting solutions.

## 2019-07-13 LAB — CBC
HCT: 41.7 % (ref 36.0–46.0)
Hemoglobin: 14.5 g/dL (ref 12.0–15.0)
MCHC: 34.7 g/dL (ref 30.0–36.0)
MCV: 93.2 fl (ref 78.0–100.0)
Platelets: 180 10*3/uL (ref 150.0–400.0)
RBC: 4.47 Mil/uL (ref 3.87–5.11)
RDW: 12.3 % (ref 11.5–15.5)
WBC: 4.8 10*3/uL (ref 4.0–10.5)

## 2019-07-13 LAB — VITAMIN D 25 HYDROXY (VIT D DEFICIENCY, FRACTURES): VITD: 21.02 ng/mL — ABNORMAL LOW (ref 30.00–100.00)

## 2019-07-13 LAB — LIPID PANEL
Cholesterol: 145 mg/dL (ref 0–200)
HDL: 57.2 mg/dL (ref >39.00)
LDL Cholesterol: 74 mg/dL (ref 0–99)
NonHDL: 87.89
Total CHOL/HDL Ratio: 3
Triglycerides: 67 mg/dL (ref 0.0–149.0)
VLDL: 13.4 mg/dL (ref 0.0–40.0)

## 2019-07-13 LAB — ALT: ALT: 12 U/L (ref 0–35)

## 2019-07-13 LAB — HEMOGLOBIN A1C: Hgb A1c MFr Bld: 5.3 % (ref 4.6–6.5)

## 2019-07-13 LAB — AST: AST: 19 U/L (ref 0–37)

## 2019-07-13 NOTE — Addendum Note (Signed)
Addended by: Varney Biles on: 07/13/2019 07:23 AM   Modules accepted: Orders

## 2019-07-16 LAB — CYTOLOGY - PAP
Comment: NEGATIVE
Diagnosis: NEGATIVE
High risk HPV: NEGATIVE

## 2019-07-18 ENCOUNTER — Other Ambulatory Visit: Payer: Self-pay | Admitting: Family Medicine

## 2019-07-18 DIAGNOSIS — E559 Vitamin D deficiency, unspecified: Secondary | ICD-10-CM | POA: Insufficient documentation

## 2019-07-18 MED ORDER — VITAMIN D (ERGOCALCIFEROL) 1.25 MG (50000 UNIT) PO CAPS: 50000.0000 [IU] | ORAL_CAPSULE | ORAL | 2 refills | Status: DC

## 2019-07-19 MED ORDER — VITAMIN D (ERGOCALCIFEROL) 1.25 MG (50000 UNIT) PO CAPS: 50000.0000 [IU] | ORAL_CAPSULE | ORAL | 0 refills | Status: AC

## 2019-07-19 NOTE — Addendum Note (Signed)
Addended by: Wyvonne Lenz on: 07/19/2019 11:03 AM   Modules accepted: Orders

## 2019-07-25 ENCOUNTER — Encounter: Payer: Self-pay | Admitting: Physical Therapy

## 2019-07-25 ENCOUNTER — Ambulatory Visit: Payer: 59 | Attending: Family Medicine | Admitting: Physical Therapy

## 2019-07-25 ENCOUNTER — Other Ambulatory Visit: Payer: Self-pay

## 2019-07-25 DIAGNOSIS — R252 Cramp and spasm: Secondary | ICD-10-CM | POA: Diagnosis present

## 2019-07-25 DIAGNOSIS — M6281 Muscle weakness (generalized): Secondary | ICD-10-CM | POA: Insufficient documentation

## 2019-07-25 DIAGNOSIS — G8929 Other chronic pain: Secondary | ICD-10-CM | POA: Diagnosis present

## 2019-07-25 DIAGNOSIS — M545 Low back pain: Secondary | ICD-10-CM | POA: Insufficient documentation

## 2019-07-25 NOTE — Patient Instructions (Signed)
Access Code: [email or web address] URL: [email or web address] Date: 07/25/2019 Prepared by: Lysle Rubens  Exercises Seated Table Hamstring Stretch - 1 x daily - 7 x weekly - 3 sets - 2 reps - 30 sec hold Supine Lower Trunk Rotation - 1 x daily - 7 x weekly - 3 sets - 10 reps Supine Piriformis Stretch with Leg Straight - 1 x daily - 7 x weekly - 3 sets - 10 reps Supine Bridge - 1 x daily - 7 x weekly - 3 sets - 10 reps - 3 sec hold Prone Press Up - 1 x daily - 7 x weekly - 3 sets - 10 reps

## 2019-07-25 NOTE — Therapy (Signed)
[facility] [place] [address] [place], [place], [address] Phone: [phone]   Fax:  [phone]  Physical Therapy Evaluation  Patient Details  Name: Morgan Ray MRN: [id number] Date of Birth: August 21, 1985 Referring Provider (PT): Luana Shu   Encounter Date: 07/25/2019   PT End of Session - 07/25/19 1444    Visit Number 1    Date for PT Re-Evaluation 09/24/19    PT Start Time 1400    PT Stop Time 1443    PT Time Calculation (min) 43 min    Activity Tolerance Patient tolerated treatment well    Behavior During Therapy [facility] for tasks assessed/performed           Past Medical History:  Diagnosis Date   Medical history non-contributory     Past Surgical History:  Procedure Laterality Date   child birth  2016 and 2018   NO PAST SURGERIES      There were no vitals filed for this visit.    Subjective Assessment - 07/25/19 1403    Subjective Pt reports LBP beginning five years ago after giving birth to oldest child. Pt denies radiating pain or numbness and tingling. Pt states that pain is centralized to LB. Pt states pain is worse when standing prolonged periods and sleeping through the night.    Patient is accompained by: Interpreter    Limitations Standing;Other (comment)   sleeping   Diagnostic tests xrays negative    Patient Stated Goals reduce LBP    Currently in Pain? Yes    Pain Score 0-No pain    Pain Location Back    Pain Orientation Lower    Pain Descriptors / Indicators Aching    Pain Type Chronic pain    Pain Onset More than a month ago    Pain Frequency Intermittent    Aggravating Factors  sleeping, standing prologned periods    Pain Relieving Factors massage did not help, repositioning              [facility] PT Assessment - 07/25/19 0001      Assessment   Medical Diagnosis chronic LBP    Referring Provider (PT) Luana Shu    Prior Therapy none      Precautions   Precautions None       Restrictions   Weight Bearing Restrictions No      Balance Screen   Has the patient fallen in the past 6 months No    Has the patient had a decrease in activity level because of a fear of falling?  No    Is the patient reluctant to leave their home because of a fear of falling?  No      Home Environment   Additional Comments no trouble with stairs      Prior Function   Level of Independence Independent      Sensation   Light Touch Appears Intact      ROM / Strength   AROM / PROM / Strength AROM;Strength      AROM   Overall AROM Comments lumbar flexion/extension ROM limited 25%; pain with extension and sidebending      Strength   Overall Strength Comments 5/5 B except hip ext/abd 4/5      Flexibility   Soft Tissue Assessment /Muscle Length yes    Hamstrings very tight    Quadriceps WFL    Piriformis mildly limited      Palpation   Palpation comment very  tender to palpation along thoracic/lumbar spine      Ambulation/Gait   Gait Comments limited rotation with gait; slightly widened BOS                      Objective measurements completed on examination: See above findings.       [place] Adult PT Treatment/Exercise - 07/25/19 0001      Exercises   Exercises Lumbar      Lumbar Exercises: Stretches   Active Hamstring Stretch Right;Left;1 rep;30 seconds    Single Knee to Chest Stretch Limitations sktc with pirifromis stretch    Lower Trunk Rotation 5 reps;10 seconds    Press Ups 10 reps      Lumbar Exercises: Supine   Bridge 10 reps;3 seconds                  PT Education - 07/25/19 1443    Education Details Pt educated on POC and HEP    Person(s) Educated Patient    Methods Explanation;Demonstration;Handout    Comprehension Verbalized understanding;Returned demonstration            PT Short Term Goals - 07/25/19 1448      PT SHORT TERM GOAL #1   Title Pt will be independent with HEP    Time 2    Period Weeks    Status  New    Target Date 08/08/19             PT Long Term Goals - 07/25/19 1448      PT LONG TERM GOAL #1   Title Pt will demonstrate lumbar extension ROM WFL with no reports of increased LBP    Time 6    Period Weeks    Status New    Target Date 09/05/19      PT LONG TERM GOAL #2   Title Pt will report able to sleep through night with no increase in LBP    Time 6    Period Weeks    Status New    Target Date 09/05/19      PT LONG TERM GOAL #3   Title Pt will demonstrate B hip ext/abd strength 5/5    Baseline 4/5    Time 6    Period Weeks    Status New    Target Date 09/05/19      PT LONG TERM GOAL #4   Title Pt will report ability to stand>1 hour with no increase in LBP    Time 6    Period Weeks    Status New    Target Date 09/05/19                  Plan - 07/25/19 1444    Clinical Impression Statement Pt presents to clinic with reports of chronic LBP present since birth of oldest child ~5 years ago. Pt worst pain is present with prolonged standing and when sleeping at night. Pt demonstrates limited lumbar ROM, stiffness of lumbar/thoracic spine, tenderness to palpation lumbar/thoracic spinous processes, and deficits in B hip extension/abduction strength. Pt denies N/T or radiating pain. Pt would benefit from skilled PT to address above impairments.    Personal Factors and Comorbidities Time since onset of injury/illness/exacerbation    Examination-Activity Limitations Bend;Locomotion Level;Stand    Examination-Participation Restrictions Community Activity;Interpersonal Relationship    Stability/Clinical Decision Making Stable/Uncomplicated    Clinical Decision Making Low    Rehab Potential Good    PT Frequency 2x /  week    PT Duration 6 weeks    PT Treatment/Interventions ADLs/Self Care Home Management;Electrical Stimulation;Iontophoresis 4mg /ml Dexamethasone;Moist Heat;Neuromuscular re-education;Balance training;Therapeutic exercise;Therapeutic  activities;Functional mobility training;Patient/family education;Manual techniques;Dry needling;Passive range of motion    PT Next Visit Plan assess SI if indicated, lumbar ROM/strength, core stab    Consulted and Agree with Plan of Care Patient           Patient will benefit from skilled therapeutic intervention in order to improve the following deficits and impairments:  Abnormal gait, Decreased range of motion, Difficulty walking, Increased muscle spasms, Pain, Hypomobility, Impaired flexibility, Decreased strength  Visit Diagnosis: Chronic bilateral low back pain without sciatica  Muscle weakness (generalized)  Cramp and spasm     Problem List Patient Active Problem List   Diagnosis Date Noted   Vitamin D deficiency 07/18/2019   Vaginal delivery 06/17/2016   Gestational diabetes 06/16/2016   PROM (premature rupture of membranes) 11/30/2014   12/02/2014, PT, DPT Lysle Rubens Rebacca [place] 07/25/2019, 2:54 PM  [facility] [place] [address] [place], [place], [place] Phone: [phone]   Fax:  [phone]  Name: Morgan Ray MRN: Lucendia Herrlich Date of Birth: 1985-11-27

## 2019-08-01 ENCOUNTER — Ambulatory Visit: Payer: 59 | Admitting: Physical Therapy

## 2019-08-07 ENCOUNTER — Encounter: Payer: Self-pay | Admitting: Physical Therapy

## 2019-08-07 ENCOUNTER — Other Ambulatory Visit: Payer: Self-pay

## 2019-08-07 ENCOUNTER — Ambulatory Visit: Payer: 59 | Attending: Family Medicine | Admitting: Physical Therapy

## 2019-08-07 DIAGNOSIS — M6281 Muscle weakness (generalized): Secondary | ICD-10-CM | POA: Diagnosis not present

## 2019-08-07 DIAGNOSIS — R252 Cramp and spasm: Secondary | ICD-10-CM | POA: Insufficient documentation

## 2019-08-07 DIAGNOSIS — G8929 Other chronic pain: Secondary | ICD-10-CM | POA: Insufficient documentation

## 2019-08-07 DIAGNOSIS — M545 Low back pain: Secondary | ICD-10-CM | POA: Insufficient documentation

## 2019-08-07 NOTE — Therapy (Signed)
[facility] [place] [address] [place], [place], [address] Phone: [phone]   Fax:  [phone]  Physical Therapy Treatment  Patient Details  Name: Morgan Ray MRN: [id number] Date of Birth: December 18, 1985 Referring Provider (PT): Luana Shu   Encounter Date: 08/07/2019   PT End of Session - 08/07/19 0923    Date for PT Re-Evaluation 09/24/19    PT Start Time 0840    PT Stop Time 0923    PT Time Calculation (min) 43 min    Activity Tolerance Patient tolerated treatment well    Behavior During Therapy [facility] for tasks assessed/performed           Past Medical History:  Diagnosis Date  . Medical history non-contributory     Past Surgical History:  Procedure Laterality Date  . child birth  2016 and 2018  . NO PAST SURGERIES      There were no vitals filed for this visit.   Subjective Assessment - 08/07/19 0840    Subjective No pain, pain mostly at night before bed an while sleeping    Currently in Pain? No/denies                             [facility] Adult PT Treatment/Exercise - 08/07/19 0001      Lumbar Exercises: Stretches   Passive Hamstring Stretch Right;Left;3 reps;20 seconds    Single Knee to Chest Stretch Limitations sktc with pirifromis stretch    Lower Trunk Rotation 3 reps;10 seconds      Lumbar Exercises: Aerobic   Nustep L4 x 6 min       Lumbar Exercises: Machines for Strengthening   Cybex Knee Extension 5lb 2x10     Cybex Knee Flexion 20lb 2x10      Lumbar Exercises: Standing   Row Theraband;20 reps;Both;Strengthening    Shoulder Extension Theraband;20 reps;Both;Strengthening      Lumbar Exercises: Seated   Sit to Stand 20 reps   Forward ball press red    Other Seated Lumbar Exercises ab sets 2x10       Lumbar Exercises: Supine   Bridge 2 seconds;15 reps;Compliant    Other Supine Lumbar Exercises LE on pball Bridges, K2C, Oblq x10                     PT  Short Term Goals - 07/25/19 1448      PT SHORT TERM GOAL #1   Title Pt will be independent with HEP    Time 2    Period Weeks    Status New    Target Date 08/08/19             PT Long Term Goals - 08/07/19 0903      PT LONG TERM GOAL #1   Title Pt will demonstrate lumbar extension ROM WFL with no reports of increased LBP    Status On-going                 Plan - 08/07/19 1517    Clinical Impression Statement Pt tolerated an initial progression toe TE well evident by no reports of increase pain. Postural cues needed with standing rows and shoulder extensions. Cue needed for core engagement doing ab sets. Some bilateral HS tightness noted with HS stretches. Cues needed to relax with passive stretching.    Personal Factors and Comorbidities Time since onset of injury/illness/exacerbation    Examination-Activity Limitations  Bend;Locomotion Level;Stand    Examination-Participation Restrictions Community Activity;Interpersonal Relationship    Stability/Clinical Decision Making Stable/Uncomplicated    Rehab Potential Good    PT Frequency 2x / week    PT Duration 6 weeks    PT Treatment/Interventions ADLs/Self Care Home Management;Electrical Stimulation;Iontophoresis 4mg /ml Dexamethasone;Moist Heat;Neuromuscular re-education;Balance training;Therapeutic exercise;Therapeutic activities;Functional mobility training;Patient/family education;Manual techniques;Dry needling;Passive range of motion    PT Next Visit Plan lumbar ROM/strength, core stab           Patient will benefit from skilled therapeutic intervention in order to improve the following deficits and impairments:  Abnormal gait, Decreased range of motion, Difficulty walking, Increased muscle spasms, Pain, Hypomobility, Impaired flexibility, Decreased strength  Visit Diagnosis: Muscle weakness (generalized)  Cramp and spasm  Chronic bilateral low back pain without sciatica     Problem List Patient Active  Problem List   Diagnosis Date Noted  . Vitamin D deficiency 07/18/2019  . Vaginal delivery 06/17/2016  . Gestational diabetes 06/16/2016  . PROM (premature rupture of membranes) 11/30/2014    12/02/2014, PTA 08/07/2019, 9:26 AM  [facility] [place] [address] [place], [place], [place] Phone: [phone]   Fax:  [phone]  Name: Morgan Ray MRN: Lucendia Herrlich Date of Birth: Jan 27, 1986

## 2019-08-10 ENCOUNTER — Encounter: Payer: Self-pay | Admitting: Physical Therapy

## 2019-08-10 ENCOUNTER — Ambulatory Visit: Payer: 59 | Admitting: Physical Therapy

## 2019-08-10 ENCOUNTER — Other Ambulatory Visit: Payer: Self-pay

## 2019-08-10 DIAGNOSIS — M6281 Muscle weakness (generalized): Secondary | ICD-10-CM | POA: Diagnosis not present

## 2019-08-10 DIAGNOSIS — R252 Cramp and spasm: Secondary | ICD-10-CM

## 2019-08-10 DIAGNOSIS — M545 Low back pain, unspecified: Secondary | ICD-10-CM

## 2019-08-10 DIAGNOSIS — G8929 Other chronic pain: Secondary | ICD-10-CM

## 2019-08-10 NOTE — Therapy (Signed)
[facility] [place] [address] [place], [place], [address] Phone: [phone]   Fax:  [phone]  Physical Therapy Treatment  Patient Details  Name: Morgan Ray MRN: [id number] Date of Birth: [birth year] Referring Provider (PT): Luana Shu   Encounter Date: 08/10/2019   PT End of Session - 08/10/19 0929    Visit Number 2    Date for PT Re-Evaluation 09/24/19    PT Start Time 0845    PT Stop Time 0935    PT Time Calculation (min) 50 min    Activity Tolerance Patient tolerated treatment well    Behavior During Therapy [name] for tasks assessed/performed           Past Medical History:  Diagnosis Date  . Medical history non-contributory     Past Surgical History:  Procedure Laterality Date  . child birth  2016 and 2018  . NO PAST SURGERIES      There were no vitals filed for this visit.   Subjective Assessment - 08/10/19 0851    Subjective Pt reports no pain this morning just pain at night    Patient is accompained by: Interpreter    Currently in Pain? No/denies                             [facility] Adult PT Treatment/Exercise - 08/10/19 0001      Lumbar Exercises: Stretches   Single Knee to Chest Stretch Limitations sktc with pirifromis stretch    Other Lumbar Stretch Exercise exercise ball fwd/lat x3 5 sec hold      Lumbar Exercises: Aerobic   Nustep L5 x 7 min      Lumbar Exercises: Machines for Strengthening   Cybex Knee Extension 10# 2x10    Cybex Knee Flexion 25#, 20# 2x10    Other Lumbar Machine Exercise rows and lats 25# 2x15    Other Lumbar Machine Exercise shoulder ext 5# 2x10      Lumbar Exercises: Supine   Dead Bug 10 reps;3 seconds    Dead Bug Limitations with exercise ball      Modalities   Modalities Moist Heat      Moist Heat Therapy   Number Minutes Moist Heat 10 Minutes    Moist Heat Location Lumbar Spine                    PT Short Term Goals -  07/25/19 1448      PT SHORT TERM GOAL #1   Title Pt will be independent with HEP    Time 2    Period Weeks    Status New    Target Date 08/08/19             PT Long Term Goals - 08/07/19 0903      PT LONG TERM GOAL #1   Title Pt will demonstrate lumbar extension ROM WFL with no reports of increased LBP    Status On-going                 Plan - 08/10/19 0930    Clinical Impression Statement Pt tolerated progression of TE well. Some reports of increased pain with rows; attempted lumbar extension over exercise ball; pt reported increased LBP. Core weakness noted with deadbugs.    PT Treatment/Interventions ADLs/Self Care Home Management;Electrical Stimulation;Iontophoresis 4mg /ml Dexamethasone;Moist Heat;Neuromuscular re-education;Balance training;Therapeutic exercise;Therapeutic activities;Functional mobility training;Patient/family education;Manual techniques;Dry needling;Passive range of motion  PT Next Visit Plan lumbar ROM/strength, core stab    Consulted and Agree with Plan of Care Patient           Patient will benefit from skilled therapeutic intervention in order to improve the following deficits and impairments:  Abnormal gait, Decreased range of motion, Difficulty walking, Increased muscle spasms, Pain, Hypomobility, Impaired flexibility, Decreased strength  Visit Diagnosis: Muscle weakness (generalized)  Cramp and spasm  Chronic bilateral low back pain without sciatica     Problem List Patient Active Problem List   Diagnosis Date Noted  . Vitamin D deficiency 07/18/2019  . Vaginal delivery 06/17/2016  . Gestational diabetes 06/16/2016  . PROM (premature rupture of membranes) 11/30/2014   Lysle Rubens, PT, DPT Maryanna Shape Clarissa Laird 08/10/2019, 9:31 AM  [facility] [place] [address] [place], [place], [address] Phone: [phone]   Fax:  [phone]  Name: Morgan Ray MRN: [id number] Date of  Birth: 05-Jul-1985

## 2019-08-13 ENCOUNTER — Other Ambulatory Visit: Payer: Self-pay

## 2019-08-13 ENCOUNTER — Ambulatory Visit: Payer: 59 | Admitting: Physical Therapy

## 2019-08-13 DIAGNOSIS — M6281 Muscle weakness (generalized): Secondary | ICD-10-CM

## 2019-08-13 DIAGNOSIS — G8929 Other chronic pain: Secondary | ICD-10-CM

## 2019-08-13 DIAGNOSIS — R252 Cramp and spasm: Secondary | ICD-10-CM

## 2019-08-13 NOTE — Therapy (Signed)
[facility] [place] [facility] [address] [place], [place], [address] Phone: [phone]   Fax:  [phone]  Physical Therapy Treatment  Patient Details  Name: Morgan Ray MRN: [id number] Date of Birth: October 28, 1985 Referring Provider (PT): Luana Shu   Encounter Date: 08/13/2019   PT End of Session - 08/13/19 0926    Visit Number 3    PT Start Time 0845    PT Stop Time 0935    PT Time Calculation (min) 50 min           Past Medical History:  Diagnosis Date  . Medical history non-contributory     Past Surgical History:  Procedure Laterality Date  . child birth  2016 and 2018  . NO PAST SURGERIES      There were no vitals filed for this visit.   Subjective Assessment - 08/13/19 0847    Subjective Patient reports feeling well with no pain.    Patient is accompained by: Interpreter    Currently in Pain? No/denies                             [facility] Adult PT Treatment/Exercise - 08/13/19 0001      Lumbar Exercises: Aerobic   Nustep L5 x 7 min      Lumbar Exercises: Machines for Strengthening   Other Lumbar Machine Exercise back ext 2 x 15 black tband    Other Lumbar Machine Exercise cable single arm low row x 15 15#       Lumbar Exercises: Standing   Other Standing Lumbar Exercises band pull aparts 2 x 15    Other Standing Lumbar Exercises lateral step downs; heel taps - 2x10 6"      Lumbar Exercises: Supine   Clam 15 reps    Clam Limitations x2; red Tband    Dead Bug 20 reps    Dead Bug Limitations with yellow weighted ball      Modalities   Modalities Moist Heat      Moist Heat Therapy   Number Minutes Moist Heat 10 Minutes    Moist Heat Location Lumbar Spine                    PT Short Term Goals - 07/25/19 1448      PT SHORT TERM GOAL #1   Title Pt will be independent with HEP    Time 2    Period Weeks    Status New    Target Date 08/08/19             PT Long  Term Goals - 08/07/19 0903      PT LONG TERM GOAL #1   Title Pt will demonstrate lumbar extension ROM WFL with no reports of increased LBP    Status On-going                 Plan - 08/13/19 0926    Clinical Impression Statement Patient tolerated progression of TE well. Increasing LBP only with deadbug at point of fatigue.    Personal Factors and Comorbidities Time since onset of injury/illness/exacerbation    Examination-Activity Limitations [place];Locomotion Level;Stand    Examination-Participation Restrictions Community Activity;Interpersonal Relationship    Rehab Potential Good    PT Frequency 2x / week    PT Duration 6 weeks    PT Treatment/Interventions ADLs/Self Care Home Management;Electrical Stimulation;Iontophoresis 4mg /ml Dexamethasone;Moist Heat;Neuromuscular re-education;Balance training;Therapeutic exercise;Therapeutic activities;Functional  mobility training;Patient/family education;Manual techniques;Dry needling;Passive range of motion    PT Next Visit Plan continue with strength and core stability; assess goals    Consulted and Agree with Plan of Care Patient           Patient will benefit from skilled therapeutic intervention in order to improve the following deficits and impairments:  Abnormal gait, Decreased range of motion, Difficulty walking, Increased muscle spasms, Pain, Hypomobility, Impaired flexibility, Decreased strength  Visit Diagnosis: Muscle weakness (generalized)  Cramp and spasm  Chronic bilateral low back pain without sciatica     Problem List Patient Active Problem List   Diagnosis Date Noted  . Vitamin D deficiency 07/18/2019  . Vaginal delivery 06/17/2016  . Gestational diabetes 06/16/2016  . PROM (premature rupture of membranes) 11/30/2014    Norva Pavlov, SPTA 08/13/2019, 9:31 AM  [facility] [place] [address] [place], [place], [address] Phone: [phone]   Fax:   [phone]  Name: Morgan Ray MRN: [id number] Date of Birth: 07-14-1985

## 2019-08-15 ENCOUNTER — Encounter: Payer: Self-pay | Admitting: Physical Therapy

## 2019-08-15 ENCOUNTER — Ambulatory Visit: Payer: 59 | Admitting: Physical Therapy

## 2019-08-15 ENCOUNTER — Other Ambulatory Visit: Payer: Self-pay

## 2019-08-15 DIAGNOSIS — M6281 Muscle weakness (generalized): Secondary | ICD-10-CM

## 2019-08-15 DIAGNOSIS — G8929 Other chronic pain: Secondary | ICD-10-CM

## 2019-08-15 DIAGNOSIS — R252 Cramp and spasm: Secondary | ICD-10-CM

## 2019-08-15 NOTE — Therapy (Signed)
[facility] [place] [facility] [address] [place], [place], [address] Phone: [phone]   Fax:  [phone]  Physical Therapy Treatment  Patient Details  Name: Morgan Ray MRN: [id number] Date of Birth: [birth year] Referring Provider (PT): Luana Shu   Encounter Date: 08/15/2019   PT End of Session - 08/15/19 0926    Visit Number 4    Date for PT Re-Evaluation 09/24/19    PT Start Time 0845    PT Stop Time 0929    PT Time Calculation (min) 44 min    Activity Tolerance Patient tolerated treatment well    Behavior During Therapy [facility] for tasks assessed/performed           Past Medical History:  Diagnosis Date  . Medical history non-contributory     Past Surgical History:  Procedure Laterality Date  . child birth  2016 and 2018  . NO PAST SURGERIES      There were no vitals filed for this visit.   Subjective Assessment - 08/15/19 0848    Subjective Pt reports doing well; only has pain at night and with sleep    Currently in Pain? No/denies    Pain Score 0-No pain                             [facility] Adult PT Treatment/Exercise - 08/15/19 0001      Lumbar Exercises: Aerobic   Nustep L5 x 7 min      Lumbar Exercises: Machines for Strengthening   Cybex Lumbar Extension black TB 2x15    Cybex Knee Extension 10# 2x15    Cybex Knee Flexion 25# 2x15    Leg Press BLE 40# 1x15, 1x10; heel raise 40# 2x15    Other Lumbar Machine Exercise rows and lats 25# 2x10    Other Lumbar Machine Exercise shoulder ext 5# 2x10; AR press 15# 1x10 B                    PT Short Term Goals - 07/25/19 1448      PT SHORT TERM GOAL #1   Title Pt will be independent with HEP    Time 2    Period Weeks    Status New    Target Date 08/08/19             PT Long Term Goals - 08/07/19 0903      PT LONG TERM GOAL #1   Title Pt will demonstrate lumbar extension ROM WFL with no reports of increased LBP     Status On-going                 Plan - 08/15/19 0927    Clinical Impression Statement Pt tolerated progression of TE well. Continues to report LBP increasing in the evening and at night with increasing fatigue/activity. Core weakness evident with lumbar/standing ex's; continue to progress.    PT Treatment/Interventions ADLs/Self Care Home Management;Electrical Stimulation;Iontophoresis 4mg /ml Dexamethasone;Moist Heat;Neuromuscular re-education;Balance training;Therapeutic exercise;Therapeutic activities;Functional mobility training;Patient/family education;Manual techniques;Dry needling;Passive range of motion    PT Next Visit Plan continue with strength and core stability; assess goals    Consulted and Agree with Plan of Care Patient           Patient will benefit from skilled therapeutic intervention in order to improve the following deficits and impairments:  Abnormal gait, Decreased range of motion, Difficulty walking, Increased muscle spasms, Pain, Hypomobility, Impaired  flexibility, Decreased strength  Visit Diagnosis: Muscle weakness (generalized)  Cramp and spasm  Chronic bilateral low back pain without sciatica     Problem List Patient Active Problem List   Diagnosis Date Noted  . Vitamin D deficiency 07/18/2019  . Vaginal delivery 06/17/2016  . Gestational diabetes 06/16/2016  . PROM (premature rupture of membranes) 11/30/2014   Lysle Rubens, PT, DPT Morgan Ray 08/15/2019, 9:28 AM  [facility] [place] [facility] [address] [place], [place], [address] Phone: [phone]   Fax:  [phone]  Name: Morgan Ray MRN: [id number] Date of Birth: 1985-07-20

## 2019-08-20 ENCOUNTER — Encounter: Payer: 59 | Admitting: Physical Therapy

## 2019-08-22 ENCOUNTER — Encounter: Payer: 59 | Admitting: Physical Therapy

## 2019-09-06 ENCOUNTER — Telehealth (INDEPENDENT_AMBULATORY_CARE_PROVIDER_SITE_OTHER): Payer: 59 | Admitting: Family Medicine

## 2019-09-06 ENCOUNTER — Encounter: Payer: Self-pay | Admitting: Family Medicine

## 2019-09-06 VITALS — Temp 96.8°F | Ht 66.0 in | Wt 139.0 lb

## 2019-09-06 DIAGNOSIS — R05 Cough: Secondary | ICD-10-CM

## 2019-09-06 DIAGNOSIS — J069 Acute upper respiratory infection, unspecified: Secondary | ICD-10-CM | POA: Diagnosis not present

## 2019-09-06 DIAGNOSIS — R053 Chronic cough: Secondary | ICD-10-CM

## 2019-09-06 MED ORDER — PREDNISONE 20 MG PO TABS: 40.0000 mg | ORAL_TABLET | Freq: Every day | ORAL | 0 refills | Status: AC

## 2019-09-06 MED ORDER — AZITHROMYCIN 250 MG PO TABS: ORAL_TABLET | ORAL | 0 refills | Status: AC

## 2019-09-06 MED ORDER — GUAIFENESIN-CODEINE 100-10 MG/5ML PO SYRP: 5.0000 mL | ORAL_SOLUTION | Freq: Every day | ORAL | 0 refills | Status: AC

## 2019-09-06 NOTE — Patient Instructions (Signed)
Health Maintenance Due  Topic Date Due  . Hepatitis C Screening  Never done  . TETANUS/TDAP  Never done  . INFLUENZA VACCINE  09/02/2019    Depression screen PHQ 2/9 04/19/2016  Decreased Interest 0  Down, Depressed, Hopeless 0  PHQ - 2 Score 0

## 2019-09-06 NOTE — Progress Notes (Signed)
Virtual Visit via Video Note  I connected with Morgan Ray on 09/06/19 at  9:00 AM EDT by a video enabled telemedicine application and verified that I am speaking with the correct person using two identifiers. Location patient: home Location provider: work  Persons participating in the virtual visit: patient, provider  I discussed the limitations of evaluation and management by telemedicine and the availability of in person appointments. The patient expressed understanding and agreed to proceed.  Chief Complaint  Patient presents with  . Acute Visit    Pt c/o cough, congestion and sore throat x 2 weeks.  Pt has been taking cough syurp with little help.  Pt also said that she has had a running nose, no fever or body chills.    HPI: Morgan Ray is a 34 y.o. female who complains of 2 week h/o cough, nasal congestion, runny nose, sore throat.  symptoms were getting better and then worsened in the past few days. Cough is productive at times. Some chest tightness. No DOE. + fatigue. No fever, chills, myalgias. No loss of taste or smell. No headache. Pt has been taking OTC cough syrup which some relief. Cough is keeping pt up at night.  Pt had covid vaccine.    Past Medical History:  Diagnosis Date  . Medical history non-contributory     Past Surgical History:  Procedure Laterality Date  . child birth  2016 and 2018  . NO PAST SURGERIES      Family History  Problem Relation Age of Onset  . Hepatitis B Father     Social History   Tobacco Use  . Smoking status: Never Smoker  . Smokeless tobacco: Never Used  Substance Use Topics  . Alcohol use: No  . Drug use: No     Current Outpatient Medications:  Marland Kitchen  Vitamin D, Ergocalciferol, (DRISDOL) 1.25 MG (50000 UNIT) CAPS capsule, Take 1 capsule (50,000 Units total) by mouth every 7 (seven) days., Disp: 12 capsule, Rfl: 0 .  Calcium Carbonate (CALCIUM 600 PO), Take 1 tablet by mouth daily. (Patient not taking: Reported on 09/06/2019),  Disp: , Rfl:  .  ibuprofen (ADVIL,MOTRIN) 600 MG tablet, Take 1 tablet (600 mg total) by mouth every 6 (six) hours. (Patient not taking: Reported on 07/11/2019), Disp: 30 tablet, Rfl: 0 .  Omega-3 Fatty Acids (FISH OIL PO), Take 2 capsules by mouth daily. Patient takes fish oil over the counter but not sure of the mg.2 daily (Patient not taking: Reported on 09/06/2019), Disp: , Rfl:  .  Prenatal Vit-Fe Fumarate-FA (PRENATAL MULTIVITAMIN) TABS tablet, Take 1 tablet by mouth daily at 12 noon. (Patient not taking: Reported on 09/06/2019), Disp: , Rfl:   No Known Allergies  There is no immunization history for the selected administration types on file for this patient.   ROS: See pertinent positives and negatives per HPI.   EXAM:  VITALS per patient if applicable: Temp (!) 96.8 F (36 C)   Ht 5\' 6"  (1.676 m)   Wt 139 lb (63 kg)   BMI 22.44 kg/m    GENERAL: alert, oriented, appears well and in no acute distress  HEENT: atraumatic, conjunctiva clear, no obvious abnormalities on inspection of external nose and ears  NECK: normal movements of the head and neck  LUNGS: on inspection no signs of respiratory distress, breathing rate appears normal, no obvious gross SOB, gasping or wheezing, no conversational dyspnea  CV: no obvious cyanosis  PSYCH/NEURO: pleasant and cooperative, speech and thought processing grossly intact  ASSESSMENT AND PLAN: 1. Persistent cough 2. Upper respiratory tract infection, unspecified type - increase water intake - nasal saline spray 2-3x/day - mucinex or mucinex DM Rx: 1 tab twice per day - guaiFENesin-codeine (ROBITUSSIN AC) 100-10 MG/5ML syrup; Take 5 mLs by mouth at bedtime.  Dispense: 120 mL; Refill: 0 - azithromycin (ZITHROMAX) 250 MG tablet; 2 tabs po x 1 day then 1 tab po daily  Dispense: 6 tablet; Refill: 0 - predniSONE (DELTASONE) 20 MG tablet; Take 2 tablets (40 mg total) by mouth daily with breakfast for 5 days.  Dispense: 10 tablet; Refill: 0 -  f/u if symptoms worsen or do not improve in 7-10 days   I discussed the assessment and treatment plan with the patient. The patient was provided an opportunity to ask questions and all were answered. The patient agreed with the plan and demonstrated an understanding of the instructions.   The patient was advised to call back or seek an in-person evaluation if the symptoms worsen or if the condition fails to improve as anticipated.   Luana Shu, DO

## 2019-10-05 ENCOUNTER — Other Ambulatory Visit: Payer: Self-pay | Admitting: Family Medicine

## 2019-10-05 DIAGNOSIS — E559 Vitamin D deficiency, unspecified: Secondary | ICD-10-CM

## 2020-02-15 ENCOUNTER — Telehealth: Payer: Self-pay | Admitting: Family Medicine

## 2020-02-15 NOTE — Telephone Encounter (Signed)
Patient tested positive for COVID, states that she has a headache and has taken Tylenol, but it's not helping. She wants to know what else she can do. Please give her a call back at [phone] and advise.

## 2020-02-15 NOTE — Telephone Encounter (Signed)
Alternate tylenol 500mg  2 tabs every 4-6hrs as needed and ibuprofen 400-600mg  every 6-8hrs as needed Drink plenty of water If she has nasal congestion, flonase 2 sprays each nostril daily or few doses of decongestant like sudafed

## 2020-02-15 NOTE — Telephone Encounter (Signed)
patient notified VIA phone and will follow recommendations. Dm/cma
